# Patient Record
Sex: Female | Born: 1965 | Hispanic: No | Marital: Married | Smoking: Never smoker
Health system: Southern US, Community
[De-identification: ages and names within clinical notes are randomized; demographics above are authoritative.]

## PROBLEM LIST (undated history)

## (undated) DIAGNOSIS — T7840XA Allergy, unspecified, initial encounter: Secondary | ICD-10-CM

## (undated) DIAGNOSIS — E785 Hyperlipidemia, unspecified: Secondary | ICD-10-CM

## (undated) DIAGNOSIS — D219 Benign neoplasm of connective and other soft tissue, unspecified: Secondary | ICD-10-CM

## (undated) DIAGNOSIS — K219 Gastro-esophageal reflux disease without esophagitis: Secondary | ICD-10-CM

## (undated) DIAGNOSIS — N938 Other specified abnormal uterine and vaginal bleeding: Secondary | ICD-10-CM

## (undated) DIAGNOSIS — D649 Anemia, unspecified: Secondary | ICD-10-CM

## (undated) DIAGNOSIS — I10 Essential (primary) hypertension: Secondary | ICD-10-CM

## (undated) HISTORY — DX: Other specified abnormal uterine and vaginal bleeding: N93.8

## (undated) HISTORY — DX: Gastro-esophageal reflux disease without esophagitis: K21.9

## (undated) HISTORY — DX: Essential (primary) hypertension: I10

## (undated) HISTORY — DX: Allergy, unspecified, initial encounter: T78.40XA

## (undated) HISTORY — DX: Benign neoplasm of connective and other soft tissue, unspecified: D21.9

## (undated) HISTORY — DX: Anemia, unspecified: D64.9

## (undated) HISTORY — DX: Hyperlipidemia, unspecified: E78.5

---

## 2003-01-07 ENCOUNTER — Encounter: Admission: RE | Admit: 2003-01-07 | Discharge: 2003-01-07 | Payer: Self-pay | Admitting: *Deleted

## 2003-01-15 ENCOUNTER — Encounter: Admission: RE | Admit: 2003-01-15 | Discharge: 2003-01-15 | Payer: Self-pay | Admitting: *Deleted

## 2003-01-21 ENCOUNTER — Encounter: Payer: Self-pay | Admitting: Obstetrics and Gynecology

## 2003-01-21 ENCOUNTER — Inpatient Hospital Stay (HOSPITAL_COMMUNITY): Admission: AD | Admit: 2003-01-21 | Discharge: 2003-01-22 | Payer: Self-pay | Admitting: Obstetrics and Gynecology

## 2003-01-21 ENCOUNTER — Encounter: Admission: RE | Admit: 2003-01-21 | Discharge: 2003-01-21 | Payer: Self-pay | Admitting: *Deleted

## 2003-01-29 ENCOUNTER — Encounter: Admission: RE | Admit: 2003-01-29 | Discharge: 2003-01-29 | Payer: Self-pay | Admitting: Family Medicine

## 2003-02-05 ENCOUNTER — Encounter: Admission: RE | Admit: 2003-02-05 | Discharge: 2003-02-05 | Payer: Self-pay | Admitting: Family Medicine

## 2003-02-09 ENCOUNTER — Encounter: Admission: RE | Admit: 2003-02-09 | Discharge: 2003-02-09 | Payer: Self-pay | Admitting: *Deleted

## 2003-02-12 ENCOUNTER — Encounter: Admission: RE | Admit: 2003-02-12 | Discharge: 2003-02-12 | Payer: Self-pay | Admitting: Family Medicine

## 2003-02-16 ENCOUNTER — Encounter: Admission: RE | Admit: 2003-02-16 | Discharge: 2003-02-16 | Payer: Self-pay | Admitting: *Deleted

## 2003-02-19 ENCOUNTER — Encounter: Admission: RE | Admit: 2003-02-19 | Discharge: 2003-02-19 | Payer: Self-pay | Admitting: Family Medicine

## 2003-02-22 ENCOUNTER — Inpatient Hospital Stay (HOSPITAL_COMMUNITY): Admission: RE | Admit: 2003-02-22 | Discharge: 2003-03-11 | Payer: Self-pay | Admitting: Family Medicine

## 2003-02-24 ENCOUNTER — Encounter: Payer: Self-pay | Admitting: *Deleted

## 2003-02-28 ENCOUNTER — Encounter: Payer: Self-pay | Admitting: *Deleted

## 2003-03-06 ENCOUNTER — Encounter: Payer: Self-pay | Admitting: *Deleted

## 2003-05-08 ENCOUNTER — Encounter: Admission: RE | Admit: 2003-05-08 | Discharge: 2003-05-08 | Payer: Self-pay | Admitting: *Deleted

## 2010-02-11 ENCOUNTER — Ambulatory Visit: Payer: Self-pay | Admitting: Diagnostic Radiology

## 2010-02-11 ENCOUNTER — Ambulatory Visit (HOSPITAL_BASED_OUTPATIENT_CLINIC_OR_DEPARTMENT_OTHER): Admission: RE | Admit: 2010-02-11 | Discharge: 2010-02-11 | Payer: Self-pay | Admitting: Unknown Physician Specialty

## 2010-12-09 NOTE — Discharge Summary (Signed)
Renee Pope, Renee Pope                            ACCOUNT NO.:  [id number]   MEDICAL RECORD NO.:  [id number]                   PATIENT TYPE:  INP   LOCATION:  9109                                 [facility]ILITY:  [facility]   PHYSICIAN:  Conni Elliot, M.D.             DATE OF BIRTH:  [birth year]   DATE OF ADMISSION:  02/22/2003  DATE OF DISCHARGE:  03/11/2003                                 DISCHARGE SUMMARY   PRIMARY CARE PHYSICIAN:  [facility].   REFERRING PHYSICIAN:  None.   CONSULTING PHYSICIAN:  None.   FINAL DIAGNOSES:  93. A 45 year old G5, now P5-0-0-5 status post normal spontaneous vaginal     delivery.  2. Insulin-dependent gestational diabetes.   PRINCIPAL PROCEDURES:  None.   ADMISSION HISTORY AND PHYSICAL:  Please see chart.   LABORATORY DATA:  Hematocrit and hemoglobin at time of discharge were 27.9  and  9.6 with fasting blood sugars in the 80s-100s.   HOSPITAL COURSE:  Renee Pope was a 45 year old G5, P4-0-0-4 who presented at  57 and [redacted] weeks gestational age by a 28-week ultrasound.  She was admitted  for induction of labor secondary to polyhydramnios and very poorly  controlled gestational diabetes mellitus.  The first attempt of induction of  labor was unsuccessful with the patient developing pretty severe  hyperstimulation of the cervix and uterus.  The induction was stopped.  Several days later another attempt at induction was performed and this, too,  resulted in hyperstimulation and again the induction was stopped.  At that  point, her information was further reviewed and it was felt best to wait  given the poor dating and the large size of the baby.  The patient was in-  house for approximately another week and a half while we attempted to gain  better control of her gestational diabetes and monitor the child.  The baby  did well throughout this time and the mother, however, did continue to have  elevated blood pressures.  However, PIH panel and  urinalysis were always  within normal limits.  She was on insulin throughout her stay of 12 units of  NPH in the morning and 10 units in the evening.  On the 15th of August  another induction of labor was begun.  At this point her cervix was open at  2, very thick, and posterior.  Attempt at placing a Foley bulb for cervical  dilation was attempted, but was unsuccessful.  The patient was started on  low dose Pitocin and she progressed over the next 24 hours to having a  normal spontaneous vaginal delivery around 7:30 p.m. on the 16th of August.  At delivery she did have a small second degree laceration which was repaired  with 3-0 Vicryl with a three cord placenta that delivered spontaneously  approximately 10 minutes after delivery.  There was no postpartum hemorrhage  and the patient  did well postpartum with no complications.  Circumcision was  performed on the baby and the baby was able to go home with mother.  The  patient desires to breast-feed the infant and desires Depo-Provera injection  for contraception.   DISCHARGE INSTRUCTIONS:  The family was instructed to follow up with [facility] in six weeks.  She was instructed to return to a normal diet and to  follow up with [facility] for her diabetes status.   DISCHARGE MEDICATIONS:  1. Prenatal vitamins one tablet p.o. daily while breast-feeding.  2. Ibuprofen 600 mg one tablet p.o. q.6h.   _________  None.   DNR status full code.     Penni Bombard, MD                          Conni Elliot, M.D.    SJ/MEDQ  D:  03/11/2003  T:  03/11/2003  Job:  [id number]   cc:   [facility]

## 2010-12-09 NOTE — Group Therapy Note (Signed)
   NAME:  Renee Pope, Renee Pope                            ACCOUNT NO.:  n/a   MEDICAL RECORD NO.:  [id number]                   PATIENT TYPE:   LOCATION:  [facility]                           FACILITY:   PHYSICIAN:  Tinnie Gens, MD                     DATE OF BIRTH:   DATE OF SERVICE:  05/08/2003                                    CLINIC NOTE   CHIEF COMPLAINT:  Postpartum check.   HISTORY OF PRESENT ILLNESS:  The patient is a 45 year old gravida 5 para 5-0-  0-5 who is followed in the high risk clinic for gestational diabetes A2-B,  insulin requiring during her last pregnancy.  She did finally give birth to  a female infant without difficulty.  She is nursing.  She has received Depo-  Provera for contraception.  She denies any problems with depression or  sadness.  She is not sleeping well but that is secondary to having an infant  at home.   PHYSICAL EXAMINATION:  VITAL SIGNS:  Her pulse is 68, blood pressure is  115/69, weight is 160.3.  GENERAL:  She is a well-developed, well-nourished [place] female in no acute  distress.  ABDOMEN:  Soft and nontender.  GENITOURINARY:  She has normal external female genitalia.  The cervix is  visualized without lesion.  The vagina is rugated and clean.  The uterus was  small and anteverted.  The cervix was not open.  Adnexa are benign.   IMPRESSION:  1. Postpartum check.  2. History of gestational diabetes which is today at 41.   PLAN:  The patient needs a Pap smear in May 2005 and have advised her to  return to the [facility] Department for continuation of her Depo-  Provera by June 01, 2003.                                                Tinnie Gens, MD    TP/MEDQ  D:  05/08/2003  T:  05/08/2003  Job:  [id number]

## 2010-12-09 NOTE — Discharge Summary (Signed)
Renee Pope, Renee Pope                            ACCOUNT NO.:  [id number]   MEDICAL RECORD NO.:  [id number]                   PATIENT TYPE:  INP   LOCATION:  9156                                 [facility]   PHYSICIAN:  Nilda Simmer, M.D.                  DATE OF BIRTH:  1966/03/15   DATE OF ADMISSION:  01/21/2003  DATE OF DISCHARGE:  01/22/2003                                 DISCHARGE SUMMARY   OBSTETRICAL CLINIC:  [facility].   PROCEDURES ON ADMISSION:  1. OB ultrasound for size and growth.  2. Fetal monitoring.   DISCHARGE DIAGNOSES:  1. Gestational diabetes, insulin-requiring.  2. Intrauterine pregnancy at 34-3/[redacted] weeks gestation.  3. Language barrier.   DISCHARGE MEDICATIONS:  1. NPH insulin 10 units subcu q.a.m., 10 units subcu q.h.s.  2. Prenatal vitamins.   FOLLOWUP APPOINTMENT:  The patient has a follow up appointment at the [facility] on Thursday, July 8th at 10:30 a.m.  She will have antenatal  testing performed at that time to include an NST and BPP.   HOSPITAL COURSE:  Ms. Thurow is a 45 year old G 5, P 4-0-0-4 presenting at 66-  2/[redacted] weeks gestation to initiate insulin regime for gestational diabetes.  The patient was initiated on NPH insulin 10 units in the morning and 10  units in the evening and sugars were monitored.  Sugars ranged between 96-  136 during hospitalization.  Fasting CBG was 96 and postprandial of 136.  The patient also received nutritional education during hospitalization.  The  patient recently received diabetic education by Lenor Coffin as an outpatient.  OB ultrasound was also performed.  On hospital day #2 the patient was eager  to be discharged home secondary to transportation issues and desires for a  discharge.  Due to the patient's insistence on being discharged on hospital  day #2, BPP was scheduled and NST was scheduled as an outpatient.   DISCHARGE LABORATORY DATA:  OB ultrasound revealed a single  fetus with a  heart rate of 131 with fetal movement; however, no fetal breathing.  A  cephalic presentation, placenta location was lateral without evidence of a  previa and it was grade I, amniotic fluid was normal with an AFI of 14.3.  Size of the infant was estimated at 35-0/7 weeks in comparison to the  patient's gestation age of 34-2/[redacted] weeks gestation.  Head circumference  consistent with 36-2/[redacted] weeks gestation, abdominal circumference consistent  with 34-6/[redacted] weeks gestation, femur length of 32-6/[redacted] weeks gestation.  Sex is  female and cervix was 5.2 cm transabdominally.    DISCHARGE INSTRUCTIONS:  1. Diet at home:  Diabetic diet.  2. Activities:  No restrictions.  3. Sexual activity:  No restrictions.  Nilda Simmer, M.D.    KS/MEDQ  D:  01/22/2003  T:  01/22/2003  Job:  [id number]   cc:   [facility]

## 2011-04-20 ENCOUNTER — Emergency Department (HOSPITAL_COMMUNITY)
Admission: EM | Admit: 2011-04-20 | Discharge: 2011-04-20 | Disposition: A | Discharge status: Home / Self Care | Payer: Medicaid Other | Attending: Emergency Medicine | Admitting: Emergency Medicine

## 2011-04-20 DIAGNOSIS — R109 Unspecified abdominal pain: Secondary | ICD-10-CM | POA: Insufficient documentation

## 2011-04-20 DIAGNOSIS — R630 Anorexia: Secondary | ICD-10-CM | POA: Insufficient documentation

## 2011-04-20 DIAGNOSIS — E876 Hypokalemia: Secondary | ICD-10-CM | POA: Insufficient documentation

## 2011-04-20 DIAGNOSIS — E871 Hypo-osmolality and hyponatremia: Secondary | ICD-10-CM | POA: Insufficient documentation

## 2011-04-20 DIAGNOSIS — Z79899 Other long term (current) drug therapy: Secondary | ICD-10-CM | POA: Insufficient documentation

## 2011-04-20 DIAGNOSIS — R51 Headache: Secondary | ICD-10-CM | POA: Insufficient documentation

## 2011-04-20 DIAGNOSIS — E119 Type 2 diabetes mellitus without complications: Secondary | ICD-10-CM | POA: Insufficient documentation

## 2011-04-20 LAB — URINALYSIS, ROUTINE W REFLEX MICROSCOPIC
Glucose, UA: NEGATIVE mg/dL
Leukocytes, UA: NEGATIVE
Specific Gravity, Urine: 1.006 (ref 1.005–1.030)
pH: 6.5 (ref 5.0–8.0)

## 2011-04-20 LAB — COMPREHENSIVE METABOLIC PANEL
ALT: 24 U/L (ref 0–35)
AST: 21 U/L (ref 0–37)
Albumin: 3.4 g/dL — ABNORMAL LOW (ref 3.5–5.2)
Calcium: 9 mg/dL (ref 8.4–10.5)
Creatinine, Ser: 0.58 mg/dL (ref 0.50–1.10)
Sodium: 128 mEq/L — ABNORMAL LOW (ref 135–145)

## 2011-04-20 LAB — CBC
HCT: 31.5 % — ABNORMAL LOW (ref 36.0–46.0)
Hemoglobin: 10.6 g/dL — ABNORMAL LOW (ref 12.0–15.0)
Platelets: 266 10*3/uL (ref 150–400)
RBC: 4.47 MIL/uL (ref 3.87–5.11)

## 2011-04-20 LAB — DIFFERENTIAL
Basophils Absolute: 0 10*3/uL (ref 0.0–0.1)
Basophils Relative: 0 % (ref 0–1)
Eosinophils Absolute: 0.2 10*3/uL (ref 0.0–0.7)

## 2011-04-21 LAB — URINE CULTURE
Colony Count: 5000
Culture  Setup Time: 201209272248

## 2011-06-14 ENCOUNTER — Encounter: Payer: Self-pay | Admitting: Family Medicine

## 2011-06-14 ENCOUNTER — Ambulatory Visit (INDEPENDENT_AMBULATORY_CARE_PROVIDER_SITE_OTHER): Payer: Self-pay | Admitting: Family Medicine

## 2011-06-14 DIAGNOSIS — E119 Type 2 diabetes mellitus without complications: Secondary | ICD-10-CM

## 2011-06-14 DIAGNOSIS — N938 Other specified abnormal uterine and vaginal bleeding: Secondary | ICD-10-CM

## 2011-06-14 DIAGNOSIS — Z01419 Encounter for gynecological examination (general) (routine) without abnormal findings: Secondary | ICD-10-CM

## 2011-06-14 DIAGNOSIS — E785 Hyperlipidemia, unspecified: Secondary | ICD-10-CM | POA: Insufficient documentation

## 2011-06-14 DIAGNOSIS — Z01812 Encounter for preprocedural laboratory examination: Secondary | ICD-10-CM

## 2011-06-14 DIAGNOSIS — I10 Essential (primary) hypertension: Secondary | ICD-10-CM | POA: Insufficient documentation

## 2011-06-14 DIAGNOSIS — D259 Leiomyoma of uterus, unspecified: Secondary | ICD-10-CM

## 2011-06-14 DIAGNOSIS — N949 Unspecified condition associated with female genital organs and menstrual cycle: Secondary | ICD-10-CM

## 2011-06-14 NOTE — Progress Notes (Signed)
  Subjective:     Renee Pope is an 45 y.o. woman who presents for irregular menses. She had been bleeding regularly. She is now bleeding every 26 days and menses are lasting 9-14 days. She changes her pad or tampon every several hours.  Dysmenorrhea:moderate, occurring throughout menses. Cyclic symptoms include: none. Current contraception: OCP (estrogen/progesterone). History of infertility: no. History of abnormal Pap smear: no.  Menstrual History: OB History    Grav Para Term Preterm Abortions TAB SAB Ect Mult Living   5 5 5  0 0 0 0 0 0 5      Patient's last menstrual period was 06/13/2011.    The following portions of the patient's history were reviewed and updated as appropriate: allergies, current medications, past family history, past medical history, past social history, past surgical history and problem list.  Review of Systems Pertinent items are noted in HPI.    Objective:    BP 169/100  Pulse 87  Temp(Src) 97.2 F (36.2 C) (Oral)  Ht 5\' 2"  (1.575 m)  Wt 170 lb 1.6 oz (77.157 kg)  BMI 31.11 kg/m2  LMP 06/13/2011  General:   alert, cooperative and no distress  Skin:    normal  Neck:  no adenopathy, no carotid bruit, no JVD, supple, symmetrical, trachea midline and thyroid not enlarged, symmetric, no tenderness/mass/nodules  Abdomen:  soft, non-tender; bowel sounds normal; no masses,  no organomegaly  Pelvic:   cervix normal in appearance, external genitalia normal, no adnexal masses or tenderness, no cervical motion tenderness, rectovaginal septum normal, uterus normal size, shape, and consistency and vagina normal without discharge     Assessment:    dysfunctional uterine bleeding    Plan:    Abdominal ultrasound. All questions answered. Blood tests: CBC with diff and TSH. Chlamydia specimen. Follow up in 4 weeks. Pap smear.   Will do endometrial biopsy when have results of ultrasound.

## 2011-06-14 NOTE — Progress Notes (Deleted)
  Subjective:    Patient ID: Renee Pope, female    DOB: 12/28/1965, 45 y.o.   MRN: [id number]  HPI    Review of Systems     Objective:   Physical Exam        Assessment & Plan:

## 2011-06-14 NOTE — Patient Instructions (Signed)
Place abnormal uterine bleeding patient instructions here.

## 2011-06-15 LAB — CBC
Hemoglobin: 10.4 g/dL — ABNORMAL LOW (ref 12.0–15.0)
MCHC: 31 g/dL (ref 30.0–36.0)
Platelets: 373 10*3/uL (ref 150–400)
RBC: 4.67 MIL/uL (ref 3.87–5.11)

## 2011-06-15 LAB — TSH: TSH: 0.607 u[IU]/mL (ref 0.350–4.500)

## 2011-06-20 ENCOUNTER — Ambulatory Visit (HOSPITAL_COMMUNITY)
Admission: RE | Admit: 2011-06-20 | Discharge: 2011-06-20 | Disposition: A | Discharge status: Home / Self Care | Payer: Medicaid Other | Source: Ambulatory Visit | Attending: Family Medicine | Admitting: Family Medicine

## 2011-06-20 DIAGNOSIS — N938 Other specified abnormal uterine and vaginal bleeding: Secondary | ICD-10-CM | POA: Insufficient documentation

## 2011-06-20 DIAGNOSIS — D259 Leiomyoma of uterus, unspecified: Secondary | ICD-10-CM | POA: Insufficient documentation

## 2011-06-20 DIAGNOSIS — N949 Unspecified condition associated with female genital organs and menstrual cycle: Secondary | ICD-10-CM | POA: Insufficient documentation

## 2011-06-20 DIAGNOSIS — N852 Hypertrophy of uterus: Secondary | ICD-10-CM | POA: Insufficient documentation

## 2011-07-19 ENCOUNTER — Other Ambulatory Visit (HOSPITAL_COMMUNITY)
Admission: RE | Admit: 2011-07-19 | Discharge: 2011-07-19 | Disposition: A | Discharge status: Home / Self Care | Payer: Self-pay | Source: Ambulatory Visit | Attending: Family | Admitting: Family

## 2011-07-19 ENCOUNTER — Encounter: Payer: Self-pay | Admitting: Family

## 2011-07-19 ENCOUNTER — Ambulatory Visit (INDEPENDENT_AMBULATORY_CARE_PROVIDER_SITE_OTHER): Payer: Self-pay | Admitting: Family

## 2011-07-19 VITALS — BP 134/88 | HR 89 | Temp 97.3°F | Ht 60.5 in | Wt 171.1 lb

## 2011-07-19 DIAGNOSIS — N938 Other specified abnormal uterine and vaginal bleeding: Secondary | ICD-10-CM

## 2011-07-19 DIAGNOSIS — N949 Unspecified condition associated with female genital organs and menstrual cycle: Secondary | ICD-10-CM | POA: Insufficient documentation

## 2011-07-19 LAB — POCT PREGNANCY, URINE: Preg Test, Ur: NEGATIVE

## 2011-07-19 LAB — T3, FREE: T3, Free: 3.3 pg/mL (ref 2.3–4.2)

## 2011-07-19 NOTE — Progress Notes (Signed)
Addended by: Faythe Casa on: 07/19/2011 03:58 PM   Modules accepted: Orders

## 2011-07-19 NOTE — Patient Instructions (Addendum)
Place abnormal uterine bleeding patient instructions here. Dysfunctional Uterine Bleeding Normally, menstrual periods begin between ages 46 to 25 in young women. A normal menstrual cycle/period may begin every 23 days up to 35 days and lasts from 1 to 7 days. Around 12 to 14 days before your menstrual period starts, ovulation (ovary produces an egg) occurs. When counting the time between menstrual periods, count from the first day of bleeding of the previous period to the first day of bleeding of the next period. Dysfunctional (abnormal) uterine bleeding is bleeding that is different from a normal menstrual period. Your periods may come earlier or later than usual. They may be lighter, have blood clots or be heavier. You may have bleeding between periods, or you may skip one period or more. You may have bleeding after sexual intercourse, bleeding after menopause, or no menstrual period. CAUSES    Pregnancy (normal, miscarriage, tubal).     IUDs (intrauterine device, birth control).     Birth control pills.     Hormone treatment.     Menopause.    Infection of the cervix.     Blood clotting problems.     Infection of the inside lining of the uterus.     Endometriosis, inside lining of the uterus growing in the pelvis and other female organs.     Adhesions (scar tissue) inside the uterus.     Obesity or severe weight loss.     Uterine polyps inside the uterus.     Cancer of the vagina, cervix, or uterus.     Ovarian cysts or polycystic ovary syndrome.     Medical problems (diabetes, thyroid disease).     Uterine fibroids (noncancerous tumor).     Problems with your female hormones.     Endometrial hyperplasia, very thick lining and enlarged cells inside of the uterus.     Medicines that interfere with ovulation.     Radiation to the pelvis or abdomen.     Chemotherapy.  DIAGNOSIS   Your doctor will discuss the history of your menstrual periods, medicines you are  taking, changes in your weight, stress in your life, and any medical problems you may have.     Your doctor will do a physical and pelvic examination.     Your doctor may want to perform certain tests to make a diagnosis, such as:     Pap test.     Blood tests.     Cultures for infection.     CT scan.     Ultrasound.    Hysteroscopy.    Laparoscopy.    MRI.    Hysterosalpingography.    D and C.     Endometrial biopsy.  TREATMENT   Treatment will depend on the cause of the dysfunctional uterine bleeding (DUB). Treatment may include:  Observing your menstrual periods for a couple of months.     Prescribing medicines for medical problems, including:     Antibiotics.    Hormones.    Birth control pills.     Removing an IUD (intrauterine device, birth control).     Surgery:    D and C (scrape and remove tissue from inside the uterus).     Laparoscopy (examine inside the abdomen with a lighted tube).     Uterine ablation (destroy lining of the uterus with electrical current, laser, heat, or freezing).     Hysteroscopy (examine cervix and uterus with a lighted tube).     Hysterectomy (remove the uterus).  HOME CARE INSTRUCTIONS    If medicines were prescribed, take exactly as directed. Do not change or switch medicines without consulting your caregiver.     Long term heavy bleeding may result in iron deficiency. Your caregiver may have prescribed iron pills. They help replace the iron that your body lost from heavy bleeding. Take exactly as directed.     Do not take aspirin or medicines that contain aspirin one week before or during your menstrual period. Aspirin may make the bleeding worse.     If you need to change your sanitary pad or tampon more than once every 2 hours, stay in bed with your feet elevated and a cold pack on your lower abdomen. Rest as much as possible, until the bleeding stops or slows down.     Eat well-balanced meals. Eat foods high in  iron. Examples are:     Leafy green vegetables.     Whole-grain breads and cereals.     Eggs.    Meat.    Liver.    Do not try to lose weight until the abnormal bleeding has stopped and your blood iron level is back to normal. Do not lift more than ten pounds or do strenuous activities when you are bleeding.     For a couple of months, make note on your calendar, marking the start and ending of your period, and the type of bleeding (light, medium, heavy, spotting, clots or missed periods). This is for your caregiver to better evaluate your problem.  SEEK MEDICAL CARE IF:    You develop nausea (feeling sick to your stomach) and vomiting, dizziness, or diarrhea while you are taking your medicine.     You are getting lightheaded or weak.     You have any problems that may be related to the medicine you are taking.     You develop pain with your DUB.     You want to remove your IUD.     You want to stop or change your birth control pills or hormones.     You have any type of abnormal bleeding mentioned above.     You are over 65 years old and have not had a menstrual period yet.     You are 45 years old and you are still having menstrual periods.     You have any of the symptoms mentioned above.     You develop a rash.  SEEK IMMEDIATE MEDICAL CARE IF:    An oral temperature above 102 F (38.9 C) develops.     You develop chills.     You are changing your sanitary pad or tampon more than once an hour.     You develop abdominal pain.     You pass out or faint.  Document Released: 07/07/2000 Document Revised: 03/22/2011 Document Reviewed: 06/08/2009 [facility] Patient Information 2012 [place], [place].

## 2011-07-19 NOTE — Progress Notes (Signed)
  Subjective:     Renee Pope is an 45 y.o. woman who presents for irregular menses. Previously seen in clinic on 06/14/11 at that visit labs were completed and ultrasound schedule.  Hgb was stable at 10.  Ultrasound showed multiple fibroids (see report) and a polyp.  Pt reports bleeding has improved with LMP on 07/12/11.  Current contraception: OCP (estrogen/progesterone). History of infertility: no. History of abnormal Pap smear: no.  Menstrual History: OB History    Grav Para Term Preterm Abortions TAB SAB Ect Mult Living   5 5 5  0 0 0 0 0 0 5       Patient's last menstrual period was 07/12/2011.    The following portions of the patient's history were reviewed and updated as appropriate: allergies, current medications, past family history, past medical history, past social history, past surgical history and problem list.  Review of Systems Pertinent items are noted in HPI.    Objective:    BP 134/88  Pulse 89  Temp(Src) 97.3 F (36.3 C) (Oral)  Ht 5' 0.5" (1.537 m)  Wt 171 lb 1.6 oz (77.61 kg)  BMI 32.87 kg/m2  LMP 07/12/2011  General:   alert, cooperative and appears stated age  Skin:    normal  Neck:  supple, symmetrical, trachea midline and thyroid not enlarged, symmetric, no tenderness/mass/nodules  Abdomen:  soft, non-tender; bowel sounds normal; no masses,  no organomegaly  Pelvic:   cervix normal in appearance, external genitalia normal, no adnexal masses or tenderness, no cervical motion tenderness, rectovaginal septum normal, vagina normal without discharge and uterus enlarged and tender with palpation.    Endometrial Biopsy Procedure Note  Pre-operative Diagnosis: Dysfunctional Uterine Bleeding; Fibroids  Post-operative Diagnosis: same  Indications: abnormal uterine bleeding, enlarged uterus  Procedure Details   Urine pregnancy test was done and result was negative.  The risks (including infection, bleeding, pain, and uterine perforation) and benefits of the  procedure were explained to the patient and Verbal informed consent was obtained.  Antibiotic prophylaxis against endocarditis was not indicated.   The patient was placed in the dorsal lithotomy position.  Bimanual exam showed the uterus to be in the neutral position.  A Graves' speculum inserted in the vagina, and the cervix prepped with povidone iodine.  Endocervical curettage with a Kevorkian curette was performed.  Uterus sounded to 8 cm;  Pipelle endometrial aspirator was used to sample the endometrium.  Sample was sent for pathologic examination.  Condition: Stable  Complications: None  Plan:  The patient was advised to call for any fever or for prolonged or severe pain or bleeding. She was advised to use NSAID as needed for mild to moderate pain. She was advised to avoid vaginal intercourse for 48 hours or until the bleeding has completely stopped.   Assessment:    dysfunctional uterine bleeding and uterine fibroids    Plan:  Consulted with Dr. Jolayne Panther > agrees with plan  Blood tests: Free T4 level and Free T4. Endometrial biopsy - see separate procedure note. Follow up in 2 weeks.

## 2011-08-03 ENCOUNTER — Ambulatory Visit (INDEPENDENT_AMBULATORY_CARE_PROVIDER_SITE_OTHER): Payer: Medicaid Other | Admitting: Physician Assistant

## 2011-08-03 VITALS — BP 122/73 | HR 85 | Temp 98.9°F | Ht 60.5 in | Wt 171.9 lb

## 2011-08-03 DIAGNOSIS — N949 Unspecified condition associated with female genital organs and menstrual cycle: Secondary | ICD-10-CM

## 2011-08-03 DIAGNOSIS — N938 Other specified abnormal uterine and vaginal bleeding: Secondary | ICD-10-CM

## 2011-08-03 MED ORDER — NORETHINDRONE ACET-ETHINYL EST 1-20 MG-MCG PO TABS: 1.0000 | ORAL_TABLET | Freq: Every day | ORAL | Status: DC

## 2011-08-03 NOTE — Patient Instructions (Signed)

## 2011-08-03 NOTE — Progress Notes (Signed)
Chief Complaint:  Results   Renee Pope is  46 y.o. [id number].  Patient's last menstrual period was 07/12/2011..  She presents complaining of Results  States light period on OCPs lasting 3-4 days. Denies pain or abd cramping.  Obstetrical/Gynecological History: OB History    Grav Para Term Preterm Abortions TAB SAB Ect Mult Living   5 5 5  0 0 0 0 0 0 5      Past Medical History: Past Medical History  Diagnosis Date  . Diabetes mellitus   . Hypertension   . Hyperlipidemia   . Allergy   . Anemia   . GERD (gastroesophageal reflux disease)   . DUB (dysfunctional uterine bleeding)     Past Surgical History: No past surgical history on file.  Family History: Family History  Problem Relation Age of Onset  . Diabetes Father   . Diabetes Sister     Social History: History  Substance Use Topics  . Smoking status: Never Smoker   . Smokeless tobacco: Never Used  . Alcohol Use: No    Allergies: No Known Allergies   (Not in a hospital admission)  Review of Systems - Negative except what has been reviewed in the HPI  Physical Exam   Blood pressure 122/73, pulse 85, temperature 98.9 F (37.2 C), temperature source Oral, height 5' 0.5" (1.537 m), weight 171 lb 14.4 oz (77.973 kg), last menstrual period 07/12/2011.  General: General appearance - alert, well appearing, and in no distress, oriented to person, place, and time and overweight Mental status - alert, oriented to person, place, and time, normal mood, behavior, speech, dress, motor activity, and thought processes, affect appropriate to mood Focused Gynecological Exam: examination not indicated  Labs: REPORT OF SURGICAL PATHOLOGY FINAL DIAGNOSIS Diagnosis Endometrium, biopsy - SCANT LOWER UTERINE SEGMENT TISSUE. - NO ATYPIA OR MALIGNANCY IDENTIFIED. - SEE COMMENT.  Free T3/T4: NL    Assessment: Patient Active Problem List  Diagnoses  . Diabetes mellitus, type 2  . HTN (hypertension)  . Hyperlipidemia    . Fibroid uterus    Plan: Continue OCPs as prescribed for management of DUB Discussed results as above and indication for surgical management to include uncontrolled bleeding and uncontrolled pain caused by fibroids that interferes with ADLs. Medical management working at present and may be continued at this time.  FU prn worsening symptoms.  Renee Pope E. 08/03/2011,4:02 PM

## 2011-09-27 ENCOUNTER — Encounter: Payer: Self-pay | Admitting: Obstetrics & Gynecology

## 2011-09-27 ENCOUNTER — Ambulatory Visit (INDEPENDENT_AMBULATORY_CARE_PROVIDER_SITE_OTHER): Payer: Self-pay | Admitting: Obstetrics & Gynecology

## 2011-09-27 VITALS — BP 118/76 | HR 88 | Temp 97.6°F | Ht 60.5 in | Wt 171.0 lb

## 2011-09-27 DIAGNOSIS — D219 Benign neoplasm of connective and other soft tissue, unspecified: Secondary | ICD-10-CM

## 2011-09-27 DIAGNOSIS — D649 Anemia, unspecified: Secondary | ICD-10-CM

## 2011-09-27 DIAGNOSIS — D259 Leiomyoma of uterus, unspecified: Secondary | ICD-10-CM

## 2011-09-27 NOTE — Progress Notes (Signed)
  Subjective:    Patient ID: Renee Pope, female    DOB: 1965/10/10, 46 y.o.   MRN: [id number]  HPI  Ms. Hhan is an [place]-speaking lady who has known fibroids. She was placed on the OCP to control her bleeding. I was unable to ascertain, even with the language line and her English-speaking daughter, if this has helped. I offered her Mirena, [place], RATH. I gave her written information and expect that with the help of her daughter she can gain a good understanding of these options. I also made it clear that if the OCPs are working, then she can just stay with that treatment  Review of Systems Endometrial biopsy negative    Objective:   Physical Exam  Bimanual exam reveals a 12 week size uterus. My exam was limited by her body habitus and her reluctance to having a pelvic exam.      Assessment & Plan:  Fibroids and anemia- above options discussed. She will come back when she has made a decision.

## 2011-10-24 ENCOUNTER — Ambulatory Visit (INDEPENDENT_AMBULATORY_CARE_PROVIDER_SITE_OTHER): Payer: Self-pay | Admitting: Family Medicine

## 2011-10-24 ENCOUNTER — Encounter: Payer: Self-pay | Admitting: Family Medicine

## 2011-10-24 VITALS — BP 123/79 | HR 90 | Ht 61.5 in | Wt 170.5 lb

## 2011-10-24 DIAGNOSIS — E119 Type 2 diabetes mellitus without complications: Secondary | ICD-10-CM

## 2011-10-24 DIAGNOSIS — E785 Hyperlipidemia, unspecified: Secondary | ICD-10-CM

## 2011-10-24 DIAGNOSIS — D649 Anemia, unspecified: Secondary | ICD-10-CM

## 2011-10-24 DIAGNOSIS — R011 Cardiac murmur, unspecified: Secondary | ICD-10-CM

## 2011-10-24 DIAGNOSIS — K219 Gastro-esophageal reflux disease without esophagitis: Secondary | ICD-10-CM | POA: Insufficient documentation

## 2011-10-24 DIAGNOSIS — D259 Leiomyoma of uterus, unspecified: Secondary | ICD-10-CM

## 2011-10-24 DIAGNOSIS — I10 Essential (primary) hypertension: Secondary | ICD-10-CM

## 2011-10-24 LAB — COMPREHENSIVE METABOLIC PANEL
ALT: 29 U/L (ref 0–35)
BUN: 8 mg/dL (ref 6–23)
CO2: 21 mEq/L (ref 19–32)
Calcium: 9.2 mg/dL (ref 8.4–10.5)
Chloride: 106 mEq/L (ref 96–112)
Creat: 0.78 mg/dL (ref 0.50–1.10)
Total Bilirubin: 0.3 mg/dL (ref 0.3–1.2)

## 2011-10-24 LAB — LDL CHOLESTEROL, DIRECT: Direct LDL: 42 mg/dL

## 2011-10-24 LAB — POCT GLYCOSYLATED HEMOGLOBIN (HGB A1C): Hemoglobin A1C: 6.3

## 2011-10-24 MED ORDER — ROSUVASTATIN CALCIUM 5 MG PO TABS: 5.0000 mg | ORAL_TABLET | Freq: Every day | ORAL | Status: DC

## 2011-10-24 MED ORDER — ESOMEPRAZOLE MAGNESIUM 40 MG PO CPDR: 40.0000 mg | DELAYED_RELEASE_CAPSULE | Freq: Every day | ORAL | Status: DC

## 2011-10-24 NOTE — Patient Instructions (Addendum)
It was great meeting you today!  For the lab work, we checked for the anemia, for the cholesterol and for the kidney function, sodium and potassium. I will be in touch with the results.   For the murmur, next visit, I will probably send a referral for an ultrasound.  At the next visit, we will also check the thyroid hormone for the goiter.   For the diabetes, you can take glipizide one in the morning and one at night. Check your sugar at the beginning. If it is lower than 100 and you are feeling bad with it being low, cut back to one pill a day.   Heart Murmur A heart murmur is an extra sound heard by your caregiver when listening to your heart with a device called a stethoscope. The sound might be a "hum" or "whoosh" sound heard when the heart beats. The sound comes from turbulence when blood flows through the heart. There are two types of heart murmurs:  Innocent (Harmless) murmurs: Most people with this type of heart murmur do not have signs or symptoms of heart problems. Many children have innocent heart murmurs. When an innocent heart murmur is found, there is no need to get tests or do treatment. Also, there is no need to restrict activities or stop playing sports. Innocent heart murmurs may be caused by many things. For example, it might be caused by a tiny hole or defect in the wall of the heart. These defects often close as a child grows. An innocent heart murmur may be heard by an examining clinician throughout your life. If you see a new caregiver, please let him or her know this was found during past exams.   Abnormal murmurs: May have signs and symptoms of heart problems. These types of murmurs can occur in children and adults. In children, abnormal heart murmurs are typically caused from heart defects that are present at birth. In adults, abnormal murmurs are usually from heart valve problems caused by disease, infection, or aging.  SYMPTOMS   Innocent (Harmless) murmurs do not cause  symptoms or require you to limit physical activity.   Many people with abnormal murmurs may or may not have symptoms. If symptoms do develop, they might include:   Shortness of breath.   Blue coloring of the skin, especially on the fingertips.   Chest pain.   Palpitations or feeling a "fluttering" or a "skipped" heart beat.   Fainting.   Persistent cough.   Getting tired much faster than expected.  DIAGNOSIS  A heart murmur might be heard during a pre-sports physical or during any type of examination. When a murmur is heard, it may suggest a possible problem. When this happens, your caregiver may ask you to see a heart specialist (cardiologist). You may also be asked to undergo one or more heart tests. In these cases, testing may vary depending upon what your caregiver heard. Tests for a heart murmur might include one or more of the following:  EKG (electrocardiogram).   Echocardiogram.   Cardiac MRI.  For children and adults who have an abnormal heart murmur and want to play sports, it is important to complete testing, review test results, and receive recommendations from your caregiver. If heart disease is present, it may be risky to play. Finding out the results of your test Not all test results are available during your visit. If your test results are not back during the visit, make an appointment with your caregiver to find out  the results. Do not assume everything is normal if you have not heard from your caregiver or the medical facility. It is important for you to follow up on all of your test results.  TREATMENT  As noted above, innocent (harmless) murmurs require no treatment or activity restriction. If the murmur represents a problem with the heart, treatment will depend upon the exact nature of the problem. In these cases, medicine or surgery may be needed to treat the problem. HOME CARE INSTRUCTIONS If you want to participate in sports or other types of strenuous physical  activity, it is important to discuss this first with your caregiver. If the murmur represents a problem with the heart and you choose to participate in sports, there is a small chance that a serious problem (including sudden death) could result.  SEEK MEDICAL CARE IF:   You feel that your symptoms are slowly worsening.   You develop any new symptoms that cause concern.   You feel that you are having side effects from any medicines prescribed.  SEEK IMMEDIATE MEDICAL CARE IF:   Chest pain develops.   You are short of breath.   You notice that your heart beats irregularly often enough to cause you to worry.   You have fainting spells.   There is a worsening of any problems that brought you or your child in for medical care.  Document Released: 08/17/2004 Document Revised: 06/29/2011 Document Reviewed: 09/17/2007 [facility] Patient Information 2012 [place], [place].

## 2011-10-25 ENCOUNTER — Encounter: Payer: Self-pay | Admitting: Family Medicine

## 2011-10-25 DIAGNOSIS — D649 Anemia, unspecified: Secondary | ICD-10-CM | POA: Insufficient documentation

## 2011-10-25 DIAGNOSIS — R011 Cardiac murmur, unspecified: Secondary | ICD-10-CM | POA: Insufficient documentation

## 2011-10-25 LAB — CBC WITH DIFFERENTIAL/PLATELET
Eosinophils Relative: 1 % (ref 0–5)
HCT: 28.1 % — ABNORMAL LOW (ref 36.0–46.0)
Hemoglobin: 8.3 g/dL — ABNORMAL LOW (ref 12.0–15.0)
Lymphocytes Relative: 32 % (ref 12–46)
Lymphs Abs: 2.5 10*3/uL (ref 0.7–4.0)
MCV: 67.5 fL — ABNORMAL LOW (ref 78.0–100.0)
Monocytes Absolute: 0.2 10*3/uL (ref 0.1–1.0)
Monocytes Relative: 2 % — ABNORMAL LOW (ref 3–12)
Neutro Abs: 5 10*3/uL (ref 1.7–7.7)
RDW: 17.5 % — ABNORMAL HIGH (ref 11.5–15.5)
WBC: 7.8 10*3/uL (ref 4.0–10.5)

## 2011-10-25 NOTE — Assessment & Plan Note (Signed)
Will check direct LDL as patient is unable to make it to the clinic in the morning. Would consider have her fast for 8 hours before a lab appointment in the afternoon, but this would not be safe with her being a diabetic.  Currently on crestor 5mg  daily.

## 2011-10-25 NOTE — Assessment & Plan Note (Addendum)
A1C: 6.3 on metformin 1000mg  bid and glipizide 5mg  daily. Talked about increasing to glipizide 5mg  bid if she can tolerate it. Recommended that patient check her glucose and that if it is lower than 100 and she is not feeling well, for her to decrease to glipizide once daily.  Also, currently on ARB for blood pressure. Will check BMP for renal function.  She will also need referral for diabetic eye exam although this may be difficult in the context of no insurance.

## 2011-10-25 NOTE — Assessment & Plan Note (Signed)
Currently on losartan 50mg  daily. At goal BP for diabetes. 123/79. Continue current therapy.

## 2011-10-25 NOTE — Assessment & Plan Note (Signed)
Follow up with Dr. Marice Potter

## 2011-10-25 NOTE — Assessment & Plan Note (Addendum)
Secondary to uterine bleeding from fibroids. Will check CBC. Patient is symptomatic as she complains of dizziness, fatigue and light headedness. Currently on niferex 150mg  bid which is already at maximum dose.

## 2011-10-25 NOTE — Assessment & Plan Note (Signed)
This could be secondary to anemia, but since she has never been told about this murmur, I will order echo at next visit. Went over heart murmurs with patient and her daughter.

## 2011-10-25 NOTE — Progress Notes (Signed)
Patient ID: Renee Pope, female   DOB: 06-Apr-1966, 46 y.o.   MRN: [id number] Patient ID: Renee Pope    DOB: 12/18/1965, 46 y.o.   MRN: [id number] --- Subjective:  Renee Pope is a 46 y.o.female with h/o fibroid, anemia, DM and GERD who presents to establish care. She is from [place] and speaks Urdu. She is accompanied by her daughter who helped translate. Concerns include: - anemia: has had a history of uterine fibroids for which she takes microgestin which has helped some, but she continues having bleeding and feels dizzy and lightheaded when she stands up. She has been taking Niferex 150mg  bid for 4-5 years. She denies any shortness of breath, any chest pain. - uterine fibroids: met with Dr. Marice Potter at [facility] where she went over her options which included: fibroid removal with hysterectomy, uterine artery embolization or mirena. They have opted for mirena as they see the risks to be much lower. She continues having bleeding. When her bleeding is controled, she uses 2-3 pads per day. When it is not well controled, she bleeds heavily for the first 3 days and then continues having lighter bleeding for a few more days.  - Diabetes: takes metformin 1000mg  bid and glipizide 5 qd. She sometimes has low blood sugar around 60-70. Latest eye doctor: 2 years ago. No vision change. No lower extremity numbness or tingling. Has been having some right foot pain.    Objective: Filed Vitals:   10/24/11 1629  BP: 123/79  Pulse: 90    Physical Examination:   General appearance - alert, appears older than stated age, in no acute distress.  Mouth - mucous membranes moist, pharynx normal without lesions Neck - supple, fullness around thyroid, no nodules or tenderness Chest - clear to auscultation, no wheezes, rales or rhonchi, symmetric air entry Heart - normal rate, regular rhythm, 2/6 systolic murmur Abdomen - soft, nontender, nondistended, no masses or organomegaly Extremities - peripheral pulses  normal, no pedal edema, no clubbing or cyanosis Microfilament: normal sensation in lower extremities.

## 2011-10-26 ENCOUNTER — Telehealth: Payer: Self-pay | Admitting: Family Medicine

## 2011-10-26 NOTE — Telephone Encounter (Signed)
Called patient about hemoglobin result of 8.3. Hemoglobin in November 2012 was 11.4. She has uterine fibroids for which she was seen at [facility] hospital and is planning on getting Mirena. Currently she is on OCP's.  Patient has been feeling some dizziness and some fatigue. She also feels like she is breathing fast and has vague chest discomfort when she does tiring house work. It goes away after a few seconds of resting. Last she felt like this was yesterday. She has been taking her ferrex 150mg  bid every day. She has also been on loestrin OCP and will be finishing this month's pack on Sunday. Last time she had a period was 30 days ago. She denies any current vaginal, urinary or rectal bleeding.  She also reports feeling more weak and tired today after having had a low glucose of 52. She ate something and rested and felt better afterwards.   Precepted with Dr. [place] who recommended to ask patient to return to clinic tomorrow afternoon for a same day appointment.  - Since patient has been taking ferrex 150mg  bid, I am not sure what other oral treatment options to suggest.  - With symptoms of increased work of breathing and lightheadedness, she may need transfusion, which she might need admission for, if the short day hospital stay doesn't cover transfusions. - may consider for her to go directly from one OCP pack to the next without taking the placebo pills, so that patient doesn't have a period when she finishes her pack. - I will also later investigate any other sources of bleeding (rectal, urine...) - hypoglycemia: recommended for patient to go back to metformin 1000mg  bid and glipizide 5mg  instead of increasing glipizide to bid.   Patient agreed over the phone to coming in tomorrow for a visit. Patient's daughter interpreted from english to Urdu over the phone.

## 2011-10-27 ENCOUNTER — Ambulatory Visit (INDEPENDENT_AMBULATORY_CARE_PROVIDER_SITE_OTHER): Payer: Self-pay | Admitting: Family Medicine

## 2011-10-27 VITALS — BP 123/82 | HR 93 | Ht 61.5 in | Wt 171.9 lb

## 2011-10-27 DIAGNOSIS — D649 Anemia, unspecified: Secondary | ICD-10-CM

## 2011-10-27 NOTE — Patient Instructions (Signed)
Thank you for coming in today. Please follow up with the GYN doctors on the 8th.  See Dr. Gwenlyn Saran in 2-3 weeks.  Make sure you take your iron pills with orange juice on an empty stomach if possible.  We may need to give blood soon if we cannot get your bleeding stopped.  Call or go to the emergency room if you get worse, have trouble breathing, have chest pains, or palpitations.

## 2011-10-29 ENCOUNTER — Encounter: Payer: Self-pay | Admitting: Family Medicine

## 2011-10-29 NOTE — Progress Notes (Signed)
Renee Pope is a 46 y.o. female who presents to [facility] today for Anemia visit. Was recently seen by PCP for anemia due to heavy vaginal bleeding. Her CBC was checked and found to be decreased from around 10 to around 8-9. She was asked to visit with a work in provider today. The patient notes mild dizzyness. Vaginal bleeding is about the same. Pt is taking her iron medications and OCPs. She has a f/u visit with OBGYN on April 8th Monday. She denies any syncope, CP or palpitations.    PMH, SH reviewed: As above. Vaginal bleeding and anemia.  ROS as above otherwise neg. No Chest pain, palpitations, SOB, Fever, Chills, Abd pain, N/V/D.  Medications reviewed. Current Outpatient Prescriptions  Medication Sig Dispense Refill  . B Complex-C (SUPER B COMPLEX PO) Take 1 tablet by mouth daily.        . Coenzyme Q10 (CO Q-10) 200 MG CAPS Take 1 tablet by mouth daily.        . Cyanocobalamin (B-12) 1000 MCG CAPS Take 2 capsules by mouth daily.        Marland Kitchen esomeprazole (NEXIUM) 40 MG capsule Take 1 capsule (40 mg total) by mouth daily before breakfast.  30 capsule  3  . glipiZIDE (GLUCOTROL) 5 MG tablet Take 5 mg by mouth daily.        . hydrochlorothiazide (HYDRODIURIL) 25 MG tablet Take 25 mg by mouth daily.        . iron polysaccharides (FERREX 150) 150 MG capsule Take 150 mg by mouth 2 (two) times daily.        Marland Kitchen loratadine (CLARITIN) 10 MG tablet Take 10 mg by mouth daily.        Marland Kitchen losartan (COZAAR) 50 MG tablet Take 50 mg by mouth daily.      . metFORMIN (GLUCOPHAGE) 500 MG tablet Take 500 mg by mouth 2 (two) times daily with a meal. Two tablets by mouth twice a day       . naproxen (NAPROSYN) 500 MG tablet Take 500 mg by mouth 2 (two) times daily with a meal.        . norethindrone-ethinyl estradiol (MICROGESTIN,JUNEL,LOESTRIN) 1-20 MG-MCG tablet Take 1 tablet by mouth daily.  1 Package  5  . potassium gluconate 595 MG TABS Take 595 mg by mouth 2 (two) times daily.        . rosuvastatin (CRESTOR) 5 MG  tablet Take 1 tablet (5 mg total) by mouth daily.  30 tablet  1  . traMADol (ULTRAM) 50 MG tablet Take 50 mg by mouth every 6 (six) hours as needed. Maximum dose= 8 tablets per day         Exam:  BP 123/82  Pulse 93  Ht 5' 1.5" (1.562 m)  Wt 171 lb 14.4 oz (77.973 kg)  BMI 31.95 kg/m2  LMP 09/26/2011 Gen: Well NAD, not orthostatic.  Lungs: CTABL Nl WOB Heart: RRR no systolic murmur at RUSB soft.  Exts: Non edematous BL  LE, warm and well perfused.   Lab Results  Component Value Date   WBC 7.8 10/24/2011   HGB 8.3* 10/24/2011   HCT 28.1* 10/24/2011   MCV 67.5* 10/24/2011   PLT 341 10/24/2011

## 2011-10-29 NOTE — Assessment & Plan Note (Signed)
Lower than I would like. I do not think she needs a RBC transfusion today. She will f/u with GYN Monday where she will likely have a mirena placed. Perhaps we can avoid any further transfusion. Plan to continue current therapy and f/u with PCP in 2-3 weeks. Red flags discussed.

## 2011-10-30 ENCOUNTER — Ambulatory Visit (INDEPENDENT_AMBULATORY_CARE_PROVIDER_SITE_OTHER): Payer: Self-pay | Admitting: Advanced Practice Midwife

## 2011-10-30 ENCOUNTER — Encounter: Payer: Self-pay | Admitting: Advanced Practice Midwife

## 2011-10-30 VITALS — BP 120/76 | HR 97 | Temp 99.9°F | Ht 61.5 in | Wt 171.8 lb

## 2011-10-30 DIAGNOSIS — N920 Excessive and frequent menstruation with regular cycle: Secondary | ICD-10-CM

## 2011-10-30 NOTE — Progress Notes (Signed)
  Subjective:    Patient ID: Renee Pope, female    DOB: 31-Aug-1965, 46 y.o.   MRN: [id number]  HPI This is a 46 y.o. female who presents for followup visit to let us know what treatment plan she wanted to pursue.  She has most recently seen Dr Marice Potter who Wrote the Following:  Ms. Renee Pope is an Erdu-speaking lady who has known fibroids. She was placed on the OCP to control her bleeding. I was unable to ascertain, even with the language line and her English-speaking daughter, if this has helped. I offered her Mirena, [place], RATH. I gave her written information and expect that with the help of her daughter she can gain a good understanding of these options. I also made it clear that if the OCPs are working, then she can just stay with that treatment   She subsequently was noted to have a Hgb of 8.3, but did not require transfusion.  She states that she has been taking OCPs for 6 months.  She now bleeds only 2-3 days, soaking 1 pad every 4-6 hrs on those days. No intercycle bleeding.   Review of Systems As listed above    Objective:   Physical Exam  Constitutional: She is oriented to person, place, and time. She appears well-developed and well-nourished. No distress.  Pulmonary/Chest: Effort normal.  Neurological: She is alert and oriented to person, place, and time.  Skin: Skin is warm and dry.  Psychiatric: She has a normal mood and affect.    Deferred per patient request      Assessment & Plan:  A:  Menorrhagia, well controlled on OCPs       Anemia, secondary to above, probably   P:  Discussed options reviewed by Dr Marice Potter. This lady would like to stay on pills      Will refer her back to Dr Gwenlyn Saran  For follow up care

## 2011-10-30 NOTE — Patient Instructions (Signed)
Oral Contraception Information Oral contraceptives (OCs) are medicines taken to prevent pregnancy. OCs work by preventing the ovaries from releasing eggs. The hormones in OCs also cause the cervical mucus to thicken, preventing the sperm from entering the uterus. The hormones also cause the uterine lining to become thin, not allowing a fertilized egg to attach to the inside of the uterus. OCs are highly effective when taken exactly as prescribed. However, OCs do not prevent sexually transmitted diseases (STDs). Safe sex practices, such as using condoms along with the pill, can help prevent STDs.  Before taking the pill, you may have a physical exam and Pap test. Your caregiver may order blood tests that may be necessary. Your caregiver will make sure you are a good candidate for oral contraception. Discuss with your caregiver the possible side effects of the OC you may be prescribed. When starting an OC, it can take 2 to 3 months for the body to adjust to the changes in hormone levels in your body.  TYPES OF ORAL CONTRACEPTION  The combination pill. This pill contains estrogen and progestin (synthetic progesterone) hormones. The combination pill comes in either 21-day or 28-day packs. With 21-day packs, you do not take pills for 7 days after the last pill. With 28-day packs, the pill is taken every day. The last 7 pills are without hormones. Certain types of pills have more than 21 hormone-containing pills.   The minipill. This pill contains the progesterone hormone only. It is taken every day continuously. The minipill comes in packs of 91 pills. The first 84 pills contain the hormones, and the last 7 pills do not. The last 7 days are when you will have your menstrual period. You may experience irregular spotting.  ADVANTAGES  Decreases premenstrual symptoms.   Treats menstrual period cramps.   Regulates the menstrual cycle.   Decreases a heavy menstrual flow.   Treats acne.   Treats abnormal  uterine bleeding.   Treats chronic pelvic pain.   Treats polycystic ovarian syndrome.   Treats endometriosis.   Can be used as emergency contraception.  DISADVANTAGES OCs can be less effective if:  You forget to take the pill at the same time every day.   You have a stomach or intestinal disease that lessens the absorption of the pill.   You take OCs with other medicines that make OCs less effective.   You take expired OCs.   You forget to restart the pill on day 7, when using the packs of 21 pills.  Document Released: 09/30/2002 Document Revised: 06/29/2011 Document Reviewed: 11/16/2010 [facility] Patient Information 2012 [place], [place].Fibroids You have been diagnosed as having a fibroid. Fibroids are smooth muscle lumps (tumors) which can occur any place in a woman's body. They are usually in the womb (uterus). The most common problem (symptom) of fibroids is bleeding. Over time this may cause low red blood cells (anemia). Other symptoms include feelings of pressure and pain in the pelvis. The diagnosis (learning what is wrong) of fibroids is made by physical exam. Sometimes tests such as an ultrasound are used. This is helpful when fibroids are felt around the ovaries and to look for tumors. TREATMENT   Most fibroids do not need surgical or medical treatment. Sometimes a tissue sample (biopsy) of the lining of the uterus is done to rule out cancer. If there is no cancer and only a small amount of bleeding, the problem can be watched.   Hormonal treatment can improve the problem.   When  surgery is needed, it can consist of removing the fibroid. Vaginal birth may not be possible after the removal of fibroids. This depends on where they are and the extent of surgery. When pregnancy occurs with fibroids it is usually normal.   Your caregiver can help decide which treatments are best for you.  HOME CARE INSTRUCTIONS   Do not use aspirin as this may increase bleeding problems.   If  your periods (menses) are heavy, record the number of pads or tampons used per month. Bring this information to your caregiver. This can help them determine the best treatment for you.  SEEK IMMEDIATE MEDICAL CARE IF:  You have pelvic pain or cramps not controlled with medications, or experience a sudden increase in pain.   You have an increase of pelvic bleeding between and during menses.   You feel lightheaded or have fainting spells.   You develop worsening belly (abdominal) pain.  Document Released: 07/07/2000 Document Revised: 06/29/2011 Document Reviewed: 02/27/2008 [facility] Patient Information 2012 [place], [place].

## 2011-10-30 NOTE — Progress Notes (Signed)
Here for follow up for fibroids, c/o cough, chest pain with coughing, sore throat x 3 days with pain in chest when coughing just today.

## 2011-11-02 ENCOUNTER — Encounter: Payer: Self-pay | Admitting: Advanced Practice Midwife

## 2011-11-17 ENCOUNTER — Ambulatory Visit: Payer: Self-pay | Admitting: Family Medicine

## 2011-11-21 ENCOUNTER — Ambulatory Visit (INDEPENDENT_AMBULATORY_CARE_PROVIDER_SITE_OTHER): Payer: Self-pay | Admitting: Family Medicine

## 2011-11-21 ENCOUNTER — Encounter: Payer: Self-pay | Admitting: Family Medicine

## 2011-11-21 VITALS — BP 110/76 | HR 83 | Ht 61.5 in | Wt 171.0 lb

## 2011-11-21 DIAGNOSIS — E119 Type 2 diabetes mellitus without complications: Secondary | ICD-10-CM

## 2011-11-21 DIAGNOSIS — D259 Leiomyoma of uterus, unspecified: Secondary | ICD-10-CM

## 2011-11-21 DIAGNOSIS — R011 Cardiac murmur, unspecified: Secondary | ICD-10-CM

## 2011-11-21 DIAGNOSIS — D649 Anemia, unspecified: Secondary | ICD-10-CM

## 2011-11-21 LAB — GLUCOSE, CAPILLARY: Glucose-Capillary: 103 mg/dL — ABNORMAL HIGH (ref 70–99)

## 2011-11-21 LAB — IRON AND TIBC: Iron: <10 ug/dL — ABNORMAL LOW (ref 42–145)

## 2011-11-21 NOTE — Patient Instructions (Signed)
We are going to check some more lab work. I will call you with the results. I would also like to get an ultrasound of your heart to evaluate the heart murmur.  I will call you to see if we need to get a transfusion.

## 2011-11-21 NOTE — Progress Notes (Signed)
Patient ID: Renee Pope, female   DOB: 05/16/1966, 46 y.o.   MRN: [id number] Patient ID: Renee Pope    DOB: January 29, 1966, 46 y.o.   MRN: [id number] --- Subjective:  Renee Pope is a 46 y.o.female with h/o uterine fibroids and diabetes who presents for follow up on her anemia - anemia: hemoglobin was 8.3 at last visit. She has been feeling some dizziness with standing at times, not every day, but if she exerts herself too much with house work. She has also been feeling heart palpitations and some shortness of breath which gets better once she rests. She denies any chest pain. This has been occuring for the last 3 weeks or so. Her uterine bleeding occurs every month when she finishes contraceptive pill . Bleeding lasts 5-6 days: first 3 days: 5-6 pads, next last days: 1 pad. This is much improved to what it used to be one year ago. She was seen by GYN this month that recommended continuing course of OCP treatment. She has been taking ferrex faithfully.  Denies any hematuria. Denies any blood in stool. Denies any current active bleeding.  ROS: see HPI PMH: reviewed allergies and medications with patient Objective: Filed Vitals:   11/21/11 1527  BP: 110/76  Pulse: 83   Orthostatics:  Supine: 125/80 HR: 85 Sitting: 117/78 HR: 81 Standing: 110/76 HR: 83 Physical Examination:   General appearance - alert, well appearing, and appears tired Eyes - pale conjunctiva Neck - supple, no significant adenopathy Chest - clear to auscultation, no wheezes, rales or rhonchi, symmetric air entry Heart - normal rate, regular rhythm, normal S1, S2, 2/5 systolic ejection murmur at right sterna birder, Abdomen - soft, nontender, nondistended, no masses or organomegaly Extremities - peripheral pulses normal, no pedal edema, no clubbing or cyanosis Rectal - rectal exam performed: normal tone, no hemorrhoids, unable to obtain stool sample for hemeoccult

## 2011-11-21 NOTE — Assessment & Plan Note (Addendum)
POC hemoglobin was low at 7.4 compared to 8.3 last month. This is concerning since she states that she takes ferrex faithfully. Her pelvic bleeding seems to be much improved. The possibility for another reason for microcytic anemia therefore needs to be investigated. Rectal bleeding from colon cancer is part of the differential for her anemia. Performed rectal exam and could not obtain stool sample for guaiac. Therefore instructed patient to collect stool cards and return them to clinic as soon as possible.  Will repeat CBC, obtain ferritin, tibc and iron to assess iron stores. Patient was not quite orthostatic from supine to standing. Patient not having chest pain due to anemia. Patient may however need transfusion if CBC confirm 7.4 point of care and patient continues feeling fatigued and dizzy. Close follow up for Thursday or Friday of this week.

## 2011-11-22 LAB — CBC WITH DIFFERENTIAL/PLATELET
Basophils Absolute: 0 10*3/uL (ref 0.0–0.1)
Eosinophils Relative: 1 % (ref 0–5)
HCT: 28.9 % — ABNORMAL LOW (ref 36.0–46.0)
Hemoglobin: 8.2 g/dL — ABNORMAL LOW (ref 12.0–15.0)
Lymphocytes Relative: 30 % (ref 12–46)
Lymphs Abs: 2.6 10*3/uL (ref 0.7–4.0)
MCV: 67.4 fL — ABNORMAL LOW (ref 78.0–100.0)
Monocytes Absolute: 0.5 10*3/uL (ref 0.1–1.0)
Monocytes Relative: 6 % (ref 3–12)
Neutro Abs: 5.4 10*3/uL (ref 1.7–7.7)
RBC: 4.29 MIL/uL (ref 3.87–5.11)
RDW: 16.5 % — ABNORMAL HIGH (ref 11.5–15.5)
WBC: 8.5 10*3/uL (ref 4.0–10.5)

## 2011-11-22 LAB — FERRITIN: Ferritin: 3 ng/mL — ABNORMAL LOW (ref 10–291)

## 2011-11-22 NOTE — Assessment & Plan Note (Signed)
Persistent 2/6 systolic murmur best heard at right sternal border. This could be secondary to anemia, but will need to work this up further to exclude any valvular abnormality. Will get echo.

## 2011-11-22 NOTE — Assessment & Plan Note (Signed)
Plan to continue on OCP as they have been controlling her bleeding.

## 2011-11-22 NOTE — Assessment & Plan Note (Deleted)
Will address at next visit since anemia took full amount of time with patient.

## 2011-11-23 ENCOUNTER — Encounter: Payer: Self-pay | Admitting: Family Medicine

## 2011-11-23 ENCOUNTER — Ambulatory Visit (INDEPENDENT_AMBULATORY_CARE_PROVIDER_SITE_OTHER): Payer: Self-pay | Admitting: Family Medicine

## 2011-11-23 VITALS — BP 107/73 | HR 77 | Temp 98.3°F | Ht 62.0 in | Wt 173.6 lb

## 2011-11-23 DIAGNOSIS — R509 Fever, unspecified: Secondary | ICD-10-CM

## 2011-11-23 DIAGNOSIS — D649 Anemia, unspecified: Secondary | ICD-10-CM

## 2011-11-23 DIAGNOSIS — D259 Leiomyoma of uterus, unspecified: Secondary | ICD-10-CM

## 2011-11-23 LAB — COMPREHENSIVE METABOLIC PANEL
Albumin: 3.7 g/dL (ref 3.5–5.2)
BUN: 9 mg/dL (ref 6–23)
Calcium: 8.7 mg/dL (ref 8.4–10.5)
Chloride: 103 mEq/L (ref 96–112)
Glucose, Bld: 205 mg/dL — ABNORMAL HIGH (ref 70–99)
Potassium: 4.1 mEq/L (ref 3.5–5.3)

## 2011-11-23 LAB — POCT URINALYSIS DIPSTICK
Nitrite, UA: NEGATIVE
Protein, UA: NEGATIVE
Spec Grav, UA: <=1.005
Urobilinogen, UA: 0.2

## 2011-11-23 LAB — POCT UA - MICROSCOPIC ONLY

## 2011-11-23 NOTE — Assessment & Plan Note (Addendum)
Fever of 3 days without apparent source.  Will check CBC with diff, ESR, CMET, and urinalysis today.  Advised to follow-up on Monday, or sooner if new symptoms or worsening.  Further workup as indicated if fever still persists.  Update: CBC and sec rate normal.  Not likely serious infection or inflammatory disease.  Willcontinue to follow clinically

## 2011-11-23 NOTE — Progress Notes (Signed)
Addended by: [place], Minyon Billiter on: 11/23/2011 05:24 PM   Modules accepted: Orders

## 2011-11-23 NOTE — Progress Notes (Signed)
  Subjective:    Patient ID: Renee Pope, female    DOB: Oct 14, 1965, 46 y.o.   MRN: [id number]  HPI Fever and chills for 3 days- work in appointment  3 days of fever, noted value of 102 last night and this morning.  Having chills + Myalgias throguhout.   No cough or trouble breathing.  No abdominal pain.  No chest pain. No diarrhea, emesis, dizziness.   Anemia: Due to fibroids.  Last period 23-24 days ago.  Taking iron twice day.  Not currently bleeding.  Review of SystemsGeneral:  Positive for fever, chills, malaise, myalgias HEENT: Negative for conjunctivitis, ear pain or drainage, rhinorrhea, nasal congestion, sore throat Respiratory:  Negative for cough, sputum, dyspnea Abdomen: Negative for abdominal pain, emesis, diarrhea Skin:  Negative for rash  MSK:  No joint pain. No dysuria.        Objective:   Physical Exam  Interpreting performed by daughter.  GEN: Alert & Oriented, No acute distress HEENT: [place]/AT. EOMI, PERRLA, no conjunctival injection or scleral icterus.  Bilateral tympanic membranes intact without erythema or effusion.  .  Nares without edema or rhinorrhea.  Oropharynx is without erythema or exudates.  No anterior or posterior cervical lymphadenopathy. CV:  Regular Rate & Rhythm, no murmur Respiratory:  Normal work of breathing, CTAB Abd:  + BS, soft, no tenderness to palpation Ext: no pre-tibial edema Skin: no rash       Assessment & Plan:

## 2011-11-23 NOTE — Patient Instructions (Signed)
Take tylenol for fever and pain  Will check your anemia and urine today.  I will let you know if it is a concern.  Follow-up with PCP on Monday  If you have worsening or new symptoms, please seek medical care sooner.

## 2011-11-24 LAB — CBC WITH DIFFERENTIAL/PLATELET
Eosinophils Relative: 1 % (ref 0–5)
HCT: 28 % — ABNORMAL LOW (ref 36.0–46.0)
Hemoglobin: 8.1 g/dL — ABNORMAL LOW (ref 12.0–15.0)
Lymphocytes Relative: 32 % (ref 12–46)
MCHC: 28.9 g/dL — ABNORMAL LOW (ref 30.0–36.0)
MCV: 66.4 fL — ABNORMAL LOW (ref 78.0–100.0)
Monocytes Absolute: 0.4 10*3/uL (ref 0.1–1.0)
Monocytes Relative: 6 % (ref 3–12)
Neutro Abs: 4.2 10*3/uL (ref 1.7–7.7)
WBC: 6.9 10*3/uL (ref 4.0–10.5)

## 2011-11-24 LAB — SEDIMENTATION RATE: Sed Rate: 20 mm/hr (ref 0–22)

## 2011-11-24 NOTE — Assessment & Plan Note (Signed)
Hemoglobin stable 

## 2011-11-27 ENCOUNTER — Ambulatory Visit (INDEPENDENT_AMBULATORY_CARE_PROVIDER_SITE_OTHER): Payer: Self-pay | Admitting: Family Medicine

## 2011-11-27 ENCOUNTER — Encounter: Payer: Self-pay | Admitting: Family Medicine

## 2011-11-27 VITALS — BP 112/72 | HR 83 | Ht 62.0 in | Wt 172.0 lb

## 2011-11-27 DIAGNOSIS — R509 Fever, unspecified: Secondary | ICD-10-CM

## 2011-11-27 DIAGNOSIS — D649 Anemia, unspecified: Secondary | ICD-10-CM

## 2011-11-27 NOTE — Progress Notes (Signed)
  Subjective:    Patient ID: Renee Pope, female    DOB: 11-11-1965, 46 y.o.   MRN: [id number]  HPI Patient presents today for followup of fever. Patient was recently seen on May 2 for fever x3 days as well as generalized malaise fatigue. A relatively broad differential was obtained including a CBC and UA. Workup was essentially negative apart from chronic iron deficiency anemia. Patient presents today for followup of this. Today, patient states her fevers and generalized malaise and myalgias have resolved. Patient states that she's not able to do her daily activities with minimal symptoms. Patient is poor she has had some intermittent leg cramping otherwise no symptoms are reported.   Review of Systems See history of present illness, otherwise review systems negative.    Objective:   Physical Exam Gen: up in chair, NAD HEENT: NCAT, EOMI, TMs clear bilaterally, poor dentition. CV: RRR, no murmurs auscultated PULM: CTAB, no wheezes, rales, rhoncii ABD: S/NT/+ bowel sounds  EXT: 2+ peripheral pulses, calves symmetric, mild lower L gastrocs tenderness     Assessment & Plan:

## 2011-11-27 NOTE — Patient Instructions (Signed)
It was good to see you today  You likely had a virus that caused your symptoms If your leg cramps worsen, give [place] a call or go to the emergency room Call if any questions,  God Bless, Doree Albee MD

## 2011-11-27 NOTE — Assessment & Plan Note (Addendum)
Clinically stable. Continue iron. ? if pt would benefit from IV iron infusion.

## 2011-11-27 NOTE — Assessment & Plan Note (Signed)
Likely flu like illness. Clinically resolved. Will continue to follow.

## 2011-11-29 LAB — HEMOCCULT GUIAC POC 1CARD (OFFICE): Card #3 Fecal Occult Blood, POC: NEGATIVE

## 2011-11-29 NOTE — Progress Notes (Signed)
Addended by: [place], Elisha Cooksey on: 11/29/2011 04:11 PM   Modules accepted: Orders

## 2011-11-30 ENCOUNTER — Ambulatory Visit: Payer: Self-pay | Admitting: Family Medicine

## 2011-12-28 ENCOUNTER — Encounter: Payer: Self-pay | Admitting: Family Medicine

## 2011-12-28 ENCOUNTER — Ambulatory Visit (INDEPENDENT_AMBULATORY_CARE_PROVIDER_SITE_OTHER): Payer: Self-pay | Admitting: Family Medicine

## 2011-12-28 VITALS — BP 110/68 | HR 80 | Wt 172.9 lb

## 2011-12-28 DIAGNOSIS — D649 Anemia, unspecified: Secondary | ICD-10-CM

## 2011-12-28 DIAGNOSIS — I10 Essential (primary) hypertension: Secondary | ICD-10-CM

## 2011-12-28 DIAGNOSIS — E119 Type 2 diabetes mellitus without complications: Secondary | ICD-10-CM

## 2011-12-28 DIAGNOSIS — R011 Cardiac murmur, unspecified: Secondary | ICD-10-CM

## 2011-12-28 LAB — CBC WITH DIFFERENTIAL/PLATELET
Basophils Absolute: 0 10*3/uL (ref 0.0–0.1)
Basophils Relative: 0 % (ref 0–1)
Hemoglobin: 7.8 g/dL — ABNORMAL LOW (ref 12.0–15.0)
MCHC: 30.1 g/dL (ref 30.0–36.0)
Monocytes Relative: 5 % (ref 3–12)
Neutro Abs: 6.4 10*3/uL (ref 1.7–7.7)
Neutrophils Relative %: 62 % (ref 43–77)

## 2011-12-28 NOTE — Patient Instructions (Signed)
For the anemia, stop taking the brown pills of the pill pack. That way you will not have a period. I will call you when I get the results from the blood work. We will decide at that point, if we need to do a transfusion.  If you start having shortness of breath, pain in your chest, come back to the clinic.  For the dizziness, make sure you drink a lot of water.  For the diabetes, we are going to stop the glipizide. Continue taking the metformin.   You will be contacted on getting an echo for the heart.

## 2011-12-29 NOTE — Assessment & Plan Note (Addendum)
With reported episodes of hypoglycemia, will stop glyburide and continue metformin 1000mg  bid. Moreover, given period of fasting in July with Ramadan, risk of hypoglycemia is even higher.  A1C in April was 6.3. Will repeat A1C at her next follow up visit to monitor effects of medication adjustment.  Will also obtain ophthalmology referral for diabetic eye exam.

## 2011-12-29 NOTE — Assessment & Plan Note (Addendum)
Still having symptoms of fatigue.  Iron studies obtained showed iron deficiency anemia. Will obtain CBC and retic to assess level at this point.  Obtained fecal occult x 3 at home and they were all negative. Routine UA done recently did not show any hematuria either. It is therefore safe to assume that anemia is from uterine fibroids.  Recommended that patient not take the "empty" pills in the ocp packet and go straight to the active pills at the end of the month. This will hopefully stop anymore menstrual bleeding which is likely contributing to this. Will also see CBC results to assess need for IV iron. Low threshold for this especially in the setting of upcoming fasting with Ramadan in July.  Murmur likely from anemia, but will nonetheless obtain echo

## 2011-12-29 NOTE — Assessment & Plan Note (Signed)
Well controled at 110/68. Continue losartan 50 daily.

## 2011-12-29 NOTE — Progress Notes (Signed)
Patient ID: Renee Pope, female   DOB: [birth year], 46 y.o.   MRN: [id number] Patient ID: Renee Pope    DOB: 1965/07/30, 46 y.o.   MRN: [id number] --- Subjective:  Renee Pope is a 46 y.o.female who presents for follow up on anemia and diabetes.  History was obtained through urdu interpreter.  Anemia and uterine fibroids: Continues having episodes of fatigue every 2-3 days. When it occurs, it can happen up to 2-3 times a day. Most commonly when she does strenuous house work. Denies any chest pain, shortness of breath. Has some dizziness where she feels like the room is spinning, more when she gets up quickly. He gets better when she stabilizes herself walks a little bit or sits down. Takes her iron supplements on a daily basis and denies skipping any doses. Also takes birth control pills every day without missed doses. She describes having one to 2 days of bleeding on the hormone free pill days. Breathing is not as significant as it had been before she had been started on birth control pill. Ramadan is about to start in July and she would like for her anemia to be controlled before then since she will be not be eating throughout the day.  Diabetes: Has had some periods of low blood sugar is in the 40s to 50s. This happens once a week. She says that when this happened she feels shaky and weak and tired. She she eats something sweet and her blood sugar goes back up. She also says that her sugars have been in the 150-200 range. She does not check her sugars on a regular basis only when she feels different or sick. Feels like her vision has decreased in the last year or so but no acute vision change recently. Denies abdominal pain nausea vomiting polyuria polydipsia.   ROS: see HPI Past Medical History: reviewed and updated medications and allergies. Social History: Tobacco: denies use  Objective: Filed Vitals:   12/28/11 1636  BP: 110/68  Pulse: 80    Physical Examination:   General appearance -  alert, well appearing, and in no distress HEENT - pale conjunctiva, oropharynx clear, EOMI, PERRLA Neck - supple, no significant adenopathy Chest - clear to auscultation, no wheezes, rales or rhonchi, symmetric air entry Heart - normal rate, regular rhythm, 2/6 systolic ejection murmur unchanged from previous, no rubs, clicks or gallops Abdomen - soft, nontender, nondistended, no masses or organomegaly Extremities - peripheral pulses normal, no pedal edema, no clubbing or cyanosis

## 2012-01-01 ENCOUNTER — Telehealth: Payer: Self-pay | Admitting: Family Medicine

## 2012-01-01 ENCOUNTER — Other Ambulatory Visit: Payer: Self-pay | Admitting: Family Medicine

## 2012-01-01 NOTE — Telephone Encounter (Signed)
Called patient and spoke with her daughter who could translate for her. I asked her to make an appointment at the short stay center for the feraheme infusion. She agreed to call and make appointment.

## 2012-01-01 NOTE — Telephone Encounter (Signed)
Faxed a form for short stay with order for feraheme IV 510mg /24ml to administer in a single 17ml dose.

## 2012-01-02 ENCOUNTER — Telehealth: Payer: Self-pay | Admitting: *Deleted

## 2012-01-02 ENCOUNTER — Other Ambulatory Visit (HOSPITAL_COMMUNITY): Payer: Self-pay | Admitting: *Deleted

## 2012-01-02 NOTE — Telephone Encounter (Signed)
Called and informed patient's daughter of appointment for 2-D echo at [facility] on 01/16/12 at 10:am. Told her to arrive 15 minutes early to 1st floor admitting.Amauri Keefe, Rodena Medin

## 2012-01-05 ENCOUNTER — Inpatient Hospital Stay (HOSPITAL_COMMUNITY)
Admission: RE | Admit: 2012-01-05 | Discharge: 2012-01-05 | Payer: Self-pay | Source: Ambulatory Visit | Attending: Family Medicine | Admitting: Family Medicine

## 2012-01-05 ENCOUNTER — Ambulatory Visit (HOSPITAL_COMMUNITY)
Admission: RE | Admit: 2012-01-05 | Discharge: 2012-01-05 | Disposition: A | Discharge status: Home / Self Care | Payer: Self-pay | Source: Ambulatory Visit | Attending: Family Medicine | Admitting: Family Medicine

## 2012-01-05 DIAGNOSIS — D649 Anemia, unspecified: Secondary | ICD-10-CM | POA: Insufficient documentation

## 2012-01-05 MED ORDER — FERUMOXYTOL INJECTION 510 MG/17 ML
510.0000 mg | Freq: Once | INTRAVENOUS | Status: AC
  Administered 2012-01-05: 510 mg via INTRAVENOUS
  Filled 2012-01-05: qty 17

## 2012-01-05 MED ORDER — SODIUM CHLORIDE 0.9 % IV SOLN
INTRAVENOUS | Status: DC
  Administered 2012-01-05: 12:00:00 via INTRAVENOUS

## 2012-01-10 ENCOUNTER — Encounter: Payer: Self-pay | Admitting: Family Medicine

## 2012-01-10 ENCOUNTER — Ambulatory Visit (INDEPENDENT_AMBULATORY_CARE_PROVIDER_SITE_OTHER): Payer: Self-pay | Admitting: Family Medicine

## 2012-01-10 VITALS — BP 107/68 | HR 87 | Temp 98.2°F | Ht 62.0 in | Wt 173.0 lb

## 2012-01-10 DIAGNOSIS — R52 Pain, unspecified: Secondary | ICD-10-CM | POA: Insufficient documentation

## 2012-01-10 NOTE — Patient Instructions (Signed)
Continue acetaminophen and naproxen for pain  Use warm soaks and baths and massage for pain.  Make appointment with Dr. Gwenlyn Saran for next available.

## 2012-01-10 NOTE — Assessment & Plan Note (Signed)
Does not seem related to iron transfusion.  Most highly concerned for depression or fibromyalgia.  She admits to sadness and anxiety but is not accepting of need for treatment at this time as she feels like she is sad only because of pain.   Advised to co ntinue acetaminophen, naprosyn, and add warm soaks for muscle relaxation. Will check CBC, TSH, CK today.  Will follow-up with PCP at next available for further evaluation  for possible depression or fibromyalgia.

## 2012-01-10 NOTE — Progress Notes (Signed)
  Subjective:    Patient ID: Renee Pope, female    DOB: April 14, 1966, 46 y.o.   MRN: [id number]  HPI Work in appt for body aches.  Here with her daughter and  Official interpreter.  Received ferriheme infusion on 6/14 for iron deficiency anemia related to uterine fibroids.  States several hours later, started to have whole body aches.  States all over body,  No area worse than other.  Arms, legs.  Over th past few days, body aches have been the same.   Takes tylenol 500 mg 2-3 times per day with some improvement.  Also took naproxen 500 mg 2-3 times per day with some improvement.   Also having headaches. For 2 days which patient states almost resolved.  States lips feel tingling.  Feels nervous or anxious since Friday.  Sometimes sadness.  States she has a lot of stress in her life.  Feels tired.    Review of Systems No numbness, weakness, vision changes, dizziness.    Objective:   Physical Exam GEN: Alert & Oriented, No acute distress, flat affect CV:  Regular Rate & Rhythm, no murmur Respiratory:  Normal work of breathing, CTAB Abd:  + BS, soft, no tenderness to palpation Ext: no pre-tibial edema MSK;  Pain out of proportion to exam when lightly palpating trapezius, arms, legs.  flinches to muscle palpation delayed.         Assessment & Plan:

## 2012-01-11 LAB — CBC
HCT: 29.2 % — ABNORMAL LOW (ref 36.0–46.0)
Hemoglobin: 8.6 g/dL — ABNORMAL LOW (ref 12.0–15.0)
MCH: 20.1 pg — ABNORMAL LOW (ref 26.0–34.0)
MCHC: 29.5 g/dL — ABNORMAL LOW (ref 30.0–36.0)
RBC: 4.27 MIL/uL (ref 3.87–5.11)

## 2012-01-12 ENCOUNTER — Telehealth: Payer: Self-pay | Admitting: Family Medicine

## 2012-01-12 NOTE — Telephone Encounter (Signed)
Called patient and spoke with her niece. She had an IV infusion of ferraheme on 01/05/12. Needs another infusion, about 8 days later. Asked her niece to make an appointment for this coming Monday. Faxed an order to the short stay section B for feraheme 150mg /24ml IV x1. Will recheck ferritin, transferrin, iron and Hgb in 1 month after this second infusion.

## 2012-01-16 ENCOUNTER — Other Ambulatory Visit: Payer: Self-pay | Admitting: Family Medicine

## 2012-01-16 ENCOUNTER — Encounter: Payer: Self-pay | Admitting: Family Medicine

## 2012-01-16 ENCOUNTER — Ambulatory Visit (HOSPITAL_COMMUNITY)
Admission: RE | Admit: 2012-01-16 | Discharge: 2012-01-16 | Disposition: A | Discharge status: Home / Self Care | Payer: Self-pay | Source: Ambulatory Visit | Attending: Family Medicine | Admitting: Family Medicine

## 2012-01-16 DIAGNOSIS — I10 Essential (primary) hypertension: Secondary | ICD-10-CM | POA: Insufficient documentation

## 2012-01-16 DIAGNOSIS — E119 Type 2 diabetes mellitus without complications: Secondary | ICD-10-CM | POA: Insufficient documentation

## 2012-01-16 DIAGNOSIS — D509 Iron deficiency anemia, unspecified: Secondary | ICD-10-CM

## 2012-01-16 DIAGNOSIS — R011 Cardiac murmur, unspecified: Secondary | ICD-10-CM | POA: Insufficient documentation

## 2012-01-16 DIAGNOSIS — E785 Hyperlipidemia, unspecified: Secondary | ICD-10-CM | POA: Insufficient documentation

## 2012-01-16 DIAGNOSIS — K219 Gastro-esophageal reflux disease without esophagitis: Secondary | ICD-10-CM | POA: Insufficient documentation

## 2012-01-16 DIAGNOSIS — I059 Rheumatic mitral valve disease, unspecified: Secondary | ICD-10-CM | POA: Insufficient documentation

## 2012-01-16 NOTE — Progress Notes (Signed)
  Echocardiogram 2D Echocardiogram has been performed.  Renee Pope 01/16/2012, 10:40 AM

## 2012-01-17 ENCOUNTER — Other Ambulatory Visit (HOSPITAL_COMMUNITY): Payer: Self-pay | Admitting: *Deleted

## 2012-01-17 MED ORDER — SODIUM CHLORIDE 0.9 % IV SOLN: Freq: Once | INTRAVENOUS | Status: DC

## 2012-01-17 MED ORDER — FERUMOXYTOL INJECTION 510 MG/17 ML: 510.0000 mg | Freq: Once | INTRAVENOUS | Status: DC

## 2012-01-18 ENCOUNTER — Encounter (HOSPITAL_COMMUNITY)
Admission: RE | Admit: 2012-01-18 | Discharge: 2012-01-18 | Disposition: A | Discharge status: Home / Self Care | Payer: Self-pay | Source: Ambulatory Visit | Attending: Family Medicine | Admitting: Family Medicine

## 2012-01-18 DIAGNOSIS — D509 Iron deficiency anemia, unspecified: Secondary | ICD-10-CM | POA: Insufficient documentation

## 2012-01-18 MED ORDER — FERUMOXYTOL INJECTION 510 MG/17 ML
510.0000 mg | Freq: Once | INTRAVENOUS | Status: AC
  Administered 2012-01-18: 510 mg via INTRAVENOUS
  Filled 2012-01-18: qty 17

## 2012-01-18 MED ORDER — SODIUM CHLORIDE 0.9 % IV SOLN
Freq: Once | INTRAVENOUS | Status: AC
  Administered 2012-01-18: 09:00:00 via INTRAVENOUS

## 2012-01-23 ENCOUNTER — Ambulatory Visit (INDEPENDENT_AMBULATORY_CARE_PROVIDER_SITE_OTHER): Payer: Self-pay | Admitting: Family Medicine

## 2012-01-23 ENCOUNTER — Encounter: Payer: Self-pay | Admitting: Family Medicine

## 2012-01-23 VITALS — BP 106/70 | HR 83 | Ht 62.0 in | Wt 172.0 lb

## 2012-01-23 DIAGNOSIS — K219 Gastro-esophageal reflux disease without esophagitis: Secondary | ICD-10-CM

## 2012-01-23 DIAGNOSIS — E119 Type 2 diabetes mellitus without complications: Secondary | ICD-10-CM

## 2012-01-23 DIAGNOSIS — D509 Iron deficiency anemia, unspecified: Secondary | ICD-10-CM

## 2012-01-23 DIAGNOSIS — D649 Anemia, unspecified: Secondary | ICD-10-CM

## 2012-01-23 DIAGNOSIS — R011 Cardiac murmur, unspecified: Secondary | ICD-10-CM

## 2012-01-23 DIAGNOSIS — J309 Allergic rhinitis, unspecified: Secondary | ICD-10-CM

## 2012-01-23 DIAGNOSIS — I10 Essential (primary) hypertension: Secondary | ICD-10-CM

## 2012-01-23 DIAGNOSIS — E785 Hyperlipidemia, unspecified: Secondary | ICD-10-CM

## 2012-01-23 MED ORDER — ROSUVASTATIN CALCIUM 5 MG PO TABS: 5.0000 mg | ORAL_TABLET | Freq: Every day | ORAL | Status: DC

## 2012-01-23 MED ORDER — POLYSACCHARIDE IRON COMPLEX 150 MG PO CAPS: 150.0000 mg | ORAL_CAPSULE | Freq: Two times a day (BID) | ORAL | Status: DC

## 2012-01-23 MED ORDER — LORATADINE 10 MG PO TABS: 10.0000 mg | ORAL_TABLET | Freq: Every day | ORAL | Status: DC

## 2012-01-23 MED ORDER — ESOMEPRAZOLE MAGNESIUM 40 MG PO CPDR: 40.0000 mg | DELAYED_RELEASE_CAPSULE | Freq: Every day | ORAL | Status: AC

## 2012-01-23 MED ORDER — LOSARTAN POTASSIUM 50 MG PO TABS: 50.0000 mg | ORAL_TABLET | Freq: Every day | ORAL | Status: DC

## 2012-01-23 NOTE — Assessment & Plan Note (Signed)
Echo obtained June 2013. No structural abnormality. Likely from anemia. Not heard on exam today

## 2012-01-23 NOTE — Progress Notes (Signed)
Patient ID: Renee Pope, female   DOB: [birth year], 46 y.o.   MRN: [id number] Patient ID: Renee Pope    DOB: [birth year], 46 y.o.   MRN: [id number] --- Subjective:  Renee Pope is a 46 y.o.female who presents for follow up on anemia and diabetes. History obtained via in person interpreter 1. Iron deficiency anemia: tolerated second dose of IV iron without trouble. Feeling more energy. Not as fatigued as previous. Bleeding stable. Ran out of ferrex 3 days ago. Has been taking birth control pill taking 2 pills of the placebo week instead of the full week. No shortness of breath, no chest pain.  2. Diabetes:  Meds: glipizide stopped last visit due to hypoglycemia. Metformin 1000mg  bid.  A1C today: 5.4  CBG's: 85-208 (post prandial). No episodes of hypoglycemia. No change in vision.   ROS: see HPI Past Medical History: reviewed and updated medications and allergies. Social History: Tobacco: denies  Objective: Filed Vitals:   01/23/12 1049  BP: 106/70  Pulse: 83    Physical Examination:   General appearance - alert, well appearing, and in no distress Eyes: pale conjunctiva Chest - clear to auscultation, no wheezes, rales or rhonchi, symmetric air entry Heart - normal rate, regular rhythm, normal S1, S2, no murmurs, rubs, clicks or gallops Abdomen - soft, nontender, nondistended, no masses or organomegaly Extremities - peripheral pulses normal, no pedal edema, no clubbing or cyanosis, normal sensation to light touch in both feet

## 2012-01-23 NOTE — Patient Instructions (Addendum)
Please come back for lab work after Ramadan.  Your A1C was 5.4 which is good. Do not take the glipizide anymore.  I will see you back in 2 months.

## 2012-01-23 NOTE — Assessment & Plan Note (Signed)
Controled. Continue current management 

## 2012-01-23 NOTE — Assessment & Plan Note (Signed)
Well controled on metformin 1000mg  bid. A1C: 5.4. No report of hypoglycemia afer d/ceing glipizide. Will continue at current metformin dose and adjust if patient starts having hypoglycemic episodes.

## 2012-01-23 NOTE — Assessment & Plan Note (Addendum)
S/p 2 IV infusio of ferreheme. Last infusion on 06/24. Will need repeat CBC, retic, ferritin and TIBC in ~ 73month after infusion. With ramadan, patient will come for lab early august. Asymptomatic at this time.

## 2012-03-05 ENCOUNTER — Other Ambulatory Visit: Payer: Self-pay

## 2012-03-05 DIAGNOSIS — D509 Iron deficiency anemia, unspecified: Secondary | ICD-10-CM

## 2012-03-05 LAB — IRON AND TIBC
%SAT: 10 % — ABNORMAL LOW (ref 20–55)
Iron: 41 ug/dL — ABNORMAL LOW (ref 42–145)
UIBC: 352 ug/dL (ref 125–400)

## 2012-03-05 LAB — CBC WITH DIFFERENTIAL/PLATELET
Basophils Absolute: 0 10*3/uL (ref 0.0–0.1)
Lymphocytes Relative: 30 % (ref 12–46)
Lymphs Abs: 1.9 10*3/uL (ref 0.7–4.0)
Neutro Abs: 4 10*3/uL (ref 1.7–7.7)
Platelets: 295 10*3/uL (ref 150–400)
RBC: 4.54 MIL/uL (ref 3.87–5.11)
RDW: 21.5 % — ABNORMAL HIGH (ref 11.5–15.5)
WBC: 6.4 10*3/uL (ref 4.0–10.5)

## 2012-03-05 LAB — RETICULOCYTES
ABS Retic: 31.8 10*3/uL (ref 19.0–186.0)
RBC.: 4.54 MIL/uL (ref 3.87–5.11)
Retic Ct Pct: 0.7 % (ref 0.4–2.3)

## 2012-03-05 NOTE — Progress Notes (Signed)
CBC WITH DIFF,RETIC,IBC AND FERRITIN DONE TODAY Renee Pope

## 2012-03-19 ENCOUNTER — Telehealth: Payer: Self-pay | Admitting: Family Medicine

## 2012-03-19 MED ORDER — METFORMIN HCL 500 MG PO TABS: 1000.0000 mg | ORAL_TABLET | Freq: Two times a day (BID) | ORAL | Status: AC

## 2012-03-19 NOTE — Telephone Encounter (Signed)
Called and informed patient Rx sent to pharmacy.[place], [place]  

## 2012-03-19 NOTE — Telephone Encounter (Signed)
Forward to PCP for refill request 

## 2012-03-19 NOTE — Telephone Encounter (Signed)
Sent refill for metformin 1000mg  bid.

## 2012-03-19 NOTE — Telephone Encounter (Signed)
Pt states that the pharmacy said that she needed to contact doctor about refilling Metformin.  She is now out [facility] [address] [facility] [place]

## 2012-03-28 ENCOUNTER — Telehealth: Payer: Self-pay | Admitting: Family Medicine

## 2012-03-28 NOTE — Telephone Encounter (Signed)
Caregiver is calling to ask questions about her eye doctor appt.  She wants to know if she can [place] because [place].

## 2012-04-05 NOTE — Telephone Encounter (Signed)
Has this been addressed?

## 2012-04-05 NOTE — Telephone Encounter (Signed)
Left message for patient to return call. She may order her glasses from wherever she feels comfortable.[place], Rodena Medin

## 2012-06-17 ENCOUNTER — Encounter: Payer: Self-pay | Admitting: Family Medicine

## 2012-06-17 ENCOUNTER — Ambulatory Visit (INDEPENDENT_AMBULATORY_CARE_PROVIDER_SITE_OTHER): Payer: Self-pay | Admitting: Family Medicine

## 2012-06-17 VITALS — BP 114/78 | HR 75 | Ht 62.0 in | Wt 176.0 lb

## 2012-06-17 DIAGNOSIS — E119 Type 2 diabetes mellitus without complications: Secondary | ICD-10-CM

## 2012-06-17 DIAGNOSIS — E785 Hyperlipidemia, unspecified: Secondary | ICD-10-CM

## 2012-06-17 DIAGNOSIS — D509 Iron deficiency anemia, unspecified: Secondary | ICD-10-CM

## 2012-06-17 DIAGNOSIS — D649 Anemia, unspecified: Secondary | ICD-10-CM

## 2012-06-17 LAB — CBC WITH DIFFERENTIAL/PLATELET
Eosinophils Absolute: 0.1 10*3/uL (ref 0.0–0.7)
Eosinophils Relative: 2 % (ref 0–5)
Lymphs Abs: 2.3 10*3/uL (ref 0.7–4.0)
MCH: 26.8 pg (ref 26.0–34.0)
MCHC: 32.9 g/dL (ref 30.0–36.0)
MCV: 81.5 fL (ref 78.0–100.0)
Platelets: 322 10*3/uL (ref 150–400)
RDW: 12.8 % (ref 11.5–15.5)

## 2012-06-17 LAB — BASIC METABOLIC PANEL
BUN: 9 mg/dL (ref 6–23)
CO2: 23 mEq/L (ref 19–32)
Chloride: 103 mEq/L (ref 96–112)
Glucose, Bld: 138 mg/dL — ABNORMAL HIGH (ref 70–99)
Potassium: 4.5 mEq/L (ref 3.5–5.3)

## 2012-06-17 MED ORDER — POLYSACCHARIDE IRON COMPLEX 150 MG PO CAPS: 150.0000 mg | ORAL_CAPSULE | Freq: Two times a day (BID) | ORAL | Status: AC

## 2012-06-17 MED ORDER — ROSUVASTATIN CALCIUM 5 MG PO TABS: 5.0000 mg | ORAL_TABLET | Freq: Every day | ORAL | Status: AC

## 2012-06-17 MED ORDER — NORETHINDRONE ACET-ETHINYL EST 1-20 MG-MCG PO TABS: 1.0000 | ORAL_TABLET | Freq: Every day | ORAL | Status: DC

## 2012-06-17 NOTE — Assessment & Plan Note (Signed)
Patient reports good control of periods. Will check CBC to make sure she is not anemic again. Refilled ferrex

## 2012-06-17 NOTE — Patient Instructions (Addendum)
Continue taking your metformin 1000mg  twice daily. To avoid the low sugars, it is important smaller meals mor often during the day.    Diabetes Meal Planning Guide The diabetes meal planning guide is a tool to help you plan your meals and snacks. It is important for people with diabetes to manage their blood glucose (sugar) levels. Choosing the right foods and the right amounts throughout your day will help control your blood glucose. Eating right can even help you improve your blood pressure and reach or maintain a healthy weight. CARBOHYDRATE COUNTING MADE EASY When you eat carbohydrates, they turn to sugar. This raises your blood glucose level. Counting carbohydrates can help you control this level so you feel better. When you plan your meals by counting carbohydrates, you can have more flexibility in what you eat and balance your medicine with your food intake. Carbohydrate counting simply means adding up the total amount of carbohydrate grams in your meals and snacks. Try to eat about the same amount at each meal. Foods with carbohydrates are listed below. Each portion below is 1 carbohydrate serving or 15 grams of carbohydrates. Ask your dietician how many grams of carbohydrates you should eat at each meal or snack. Grains and Starches  1 slice bread.   English muffin or hotdog/hamburger bun.   cup cold cereal (unsweetened).   cup cooked pasta or rice.   cup starchy vegetables (corn, potatoes, peas, beans, winter squash).  1 tortilla (6 inches).   bagel.  1 waffle or pancake (size of a CD).   cup cooked cereal.  4 to 6 small crackers. *Whole grain is recommended. Fruit  1 cup fresh unsweetened berries, melon, papaya, pineapple.  1 small fresh fruit.   banana or mango.   cup fruit juice (4 oz unsweetened).   cup canned fruit in natural juice or water.  2 tbs dried fruit.  12 to 15 grapes or cherries. Milk and Yogurt  1 cup fat-free or 1% milk.  1 cup soy  milk.  6 oz light yogurt with sugar-free sweetener.  6 oz low-fat soy yogurt.  6 oz plain yogurt. Vegetables  1 cup raw or  cup cooked is counted as 0 carbohydrates or a "free" food.  If you eat 3 or more servings at 1 meal, count them as 1 carbohydrate serving. Other Carbohydrates   oz chips or pretzels.   cup ice cream or frozen yogurt.   cup sherbet or sorbet.  2 inch square cake, no frosting.  1 tbs honey, sugar, jam, jelly, or syrup.  2 small cookies.  3 squares of graham crackers.  3 cups popcorn.  6 crackers.  1 cup broth-based soup.  Count 1 cup casserole or other mixed foods as 2 carbohydrate servings.  Foods with less than 20 calories in a serving may be counted as 0 carbohydrates or a "free" food. You may want to purchase a book or computer software that lists the carbohydrate gram counts of different foods. In addition, the nutrition facts panel on the labels of the foods you eat are a good source of this information. The label will tell you how big the serving size is and the total number of carbohydrate grams you will be eating per serving. Divide this number by 15 to obtain the number of carbohydrate servings in a portion. Remember, 1 carbohydrate serving equals 15 grams of carbohydrate. SERVING SIZES Measuring foods and serving sizes helps you make sure you are getting the right amount of food. The  list below tells how big or small some common serving sizes are.  1 oz.........4 stacked dice.  3 oz........Marland KitchenDeck of cards.  1 tsp.......Marland KitchenTip of little finger.  1 tbs......Marland KitchenMarland KitchenThumb.  2 tbs.......Marland KitchenGolf ball.   cup......Marland KitchenHalf of a fist.  1 cup.......Marland KitchenA fist. SAMPLE DIABETES MEAL PLAN Below is a sample meal plan that includes foods from the grain and starches, dairy, vegetable, fruit, and meat groups. A dietician can individualize a meal plan to fit your calorie needs and tell you the number of servings needed from each food group. However,  controlling the total amount of carbohydrates in your meal or snack is more important than making sure you include all of the food groups at every meal. You may interchange carbohydrate containing foods (dairy, starches, and fruits). The meal plan below is an example of a 2000 calorie diet using carbohydrate counting. This meal plan has 17 carbohydrate servings. Breakfast  1 cup oatmeal (2 carb servings).   cup light yogurt (1 carb serving).  1 cup blueberries (1 carb serving).   cup almonds. Snack  1 large apple (2 carb servings).  1 low-fat string cheese stick. Lunch  Chicken breast salad.  1 cup spinach.   cup chopped tomatoes.  2 oz chicken breast, sliced.  2 tbs low-fat [place] dressing.  12 whole-wheat crackers (2 carb servings).  12 to 15 grapes (1 carb serving).  1 cup low-fat milk (1 carb serving). Snack  1 cup carrots.   cup hummus (1 carb serving). Dinner  3 oz broiled salmon.  1 cup brown rice (3 carb servings). Snack  1  cups steamed broccoli (1 carb serving) drizzled with 1 tsp olive oil and lemon juice.  1 cup light pudding (2 carb servings).

## 2012-06-17 NOTE — Progress Notes (Signed)
Patient ID: Renee Pope    DOB: [birth year], 46 y.o.   MRN: [id number] --- Subjective:  Renee Pope is a 46 y.o.female with h/o DM2, HTN, iron deficiency anemia who presents for follow up on diabetes.  History was obtained with aid of an Urdu interpreter. - DM: patient feels like her sugars have been fluctuating. She reports episodes of low CBG's in the 60 and 70's where she feels shaky in her legs and weak overall. These episodes occur once a week to every 2-3 days. She thinks they might occur more when she goes a long time period between meals. When she has these episodes, she drinks milk which helps bring sugar up.  She also reports more elevated CBG's in the 200-250's sometimes fasting, sometimes not. During those times she feels like a "sugar rush" to her head.  Patient takes metformin 1000mg  bid. Glipizide was stopped at her last visit for concern of hypoglycemia.  She reports eating vegetables but mostly Naan, milk and tea, as she is trying to follow a special diabetic diet.  She denies any blurred vision, any loss of sensation in lower extremities.   ROS: see HPI Past Medical History: reviewed and updated medications and allergies. Social History: Tobacco: none  Objective: Filed Vitals:   06/17/12 1051  BP: 114/78  Pulse: 75    Physical Examination:   General appearance - alert, well appearing, and in no distress Eyes - pink conjunctiva Mouth - mucous membranes moist, pharynx normal without lesions Chest - clear to auscultation, no wheezes, rales or rhonchi, symmetric air entry Heart - normal rate, regular rhythm, normal S1, S2, 2/6 systolic murmur.  Abdomen - soft, nontender, nondistended, no masses or organomegaly Extremities - peripheral pulses normal, no pedal edema  Diabetic Foot exam: normal sensation with microfilament, small calluces on heels R>L. No ulcers or sores.

## 2012-06-17 NOTE — Assessment & Plan Note (Addendum)
A1C today at 6.5 compared to 5.4 in July 2013. Currently on metformin 1000mg  bid. Was also on glipizide which was stopped due to concerns of hypoglycemia. Currently at goal A1C. Reassured patient that the 200-250's she is sometimes reading are ok. Reviewed taking hard candy or juice (although patient doesn't like juice) in case of hypoglycemia. Also recommended that she eat more frequent meals, as it appears that the hypoglycemic episodes occur when she goes for 5-6 hours between meals.   Also reviewed types of foods to eat more of with diabetes.

## 2012-06-18 ENCOUNTER — Encounter: Payer: Self-pay | Admitting: Family Medicine

## 2012-09-10 ENCOUNTER — Other Ambulatory Visit: Payer: Self-pay | Admitting: Family Medicine

## 2012-09-18 ENCOUNTER — Ambulatory Visit: Payer: Self-pay | Admitting: Family Medicine

## 2012-09-18 ENCOUNTER — Ambulatory Visit (INDEPENDENT_AMBULATORY_CARE_PROVIDER_SITE_OTHER): Payer: No Typology Code available for payment source | Admitting: Family Medicine

## 2012-09-18 ENCOUNTER — Encounter: Payer: Self-pay | Admitting: Family Medicine

## 2012-09-18 VITALS — BP 138/82 | HR 83 | Temp 98.7°F | Ht 66.0 in | Wt 176.9 lb

## 2012-09-18 DIAGNOSIS — R238 Other skin changes: Secondary | ICD-10-CM | POA: Insufficient documentation

## 2012-09-18 DIAGNOSIS — M25519 Pain in unspecified shoulder: Secondary | ICD-10-CM | POA: Insufficient documentation

## 2012-09-18 DIAGNOSIS — D649 Anemia, unspecified: Secondary | ICD-10-CM

## 2012-09-18 DIAGNOSIS — M25569 Pain in unspecified knee: Secondary | ICD-10-CM | POA: Insufficient documentation

## 2012-09-18 DIAGNOSIS — E119 Type 2 diabetes mellitus without complications: Secondary | ICD-10-CM

## 2012-09-18 DIAGNOSIS — L989 Disorder of the skin and subcutaneous tissue, unspecified: Secondary | ICD-10-CM

## 2012-09-18 LAB — POCT SKIN KOH: Skin KOH, POC: NEGATIVE

## 2012-09-18 MED ORDER — FERROUS SULFATE 325 (65 FE) MG PO TABS: 325.0000 mg | ORAL_TABLET | Freq: Two times a day (BID) | ORAL | Status: AC

## 2012-09-18 MED ORDER — TRAMADOL HCL 50 MG PO TABS: 50.0000 mg | ORAL_TABLET | Freq: Three times a day (TID) | ORAL | Status: AC | PRN

## 2012-09-18 MED ORDER — NORETHINDRONE ACET-ETHINYL EST 1-20 MG-MCG PO TABS: 1.0000 | ORAL_TABLET | Freq: Every day | ORAL | Status: AC

## 2012-09-18 MED ORDER — GLIPIZIDE 5 MG PO TABS: 5.0000 mg | ORAL_TABLET | Freq: Every day | ORAL | Status: AC

## 2012-09-18 NOTE — Addendum Note (Signed)
Addended by: [place], Skylan Lara on: 09/18/2012 03:09 PM   Modules accepted: Orders

## 2012-09-18 NOTE — Patient Instructions (Addendum)
For the shoulder pain, do the exercises we talked about.  For the leg pain, you can take the new medicine I sent called tramadol.   Make an appointment with me for removal of skin tag.

## 2012-09-18 NOTE — Assessment & Plan Note (Signed)
Lower leg pain. No swelling and no evidence of DVT. No pain currently. Normal perfusion. Worst with sitting rather than walking goes against PVD. Electrolytes in November within normal limits. Likely muscle aches from standing all day. Aleve or tramadol if pain not controled with aleve.

## 2012-09-18 NOTE — Assessment & Plan Note (Signed)
A1C: 6.9 from 6.5. Patient concerned about elevated Blood sugar. Will add back glipizide and monitor episodes of low sugar. Recommended checking sugars.  Also discussed decreasing amount of Naan bread, potatoes and rice.  Trouble with paying medicine. Gave info about MAP.

## 2012-09-18 NOTE — Assessment & Plan Note (Signed)
Underlying area looks like acanthosis nigricans, but will obtain slide to rule out tinea versicolor.  Skin tag with broader base is irritated. Differential for base of skin tag include: seborrheic keratosis vs skin tag. Patient to make appointment for skin tag removal and punch biopsy of area.

## 2012-09-18 NOTE — Assessment & Plan Note (Signed)
Bilateral. Likely from arthritis or rotator cuff. No evidence of frozen shoulder, cervical spine impingement. Gave shoulder strengthening exercises. Tramadol for pain

## 2012-09-18 NOTE — Progress Notes (Signed)
Patient ID: Renee Pope    DOB: [birth year], 47 y.o.   MRN: [id number] --- Subjective:  Renee Pope is a 47 y.o.female who presents for follow up.  - itching and burning on left side of neck. X 6 months. Worst in the last few weeks with more itching and burning. No bleeding.  - pain in leg at night: achy pain. No tingling no burning. Worst with sitting and laying down. Better with walking. X few months. Takes naproxen 500mg  which helps a little. No weakness.  - shoulder pain: both shoulders. Worst after working in [employer]. Intermittent, not every day. A few months.  - DM2: feels shakiness 2 times in 1 month. did not check sugar at the time. Better with juice or sugar.  Takes metformin 1000mg  bid. No tingling in lower extremities. Vision ok with glasses  ROS: see HPI Past Medical History: reviewed and updated medications and allergies. Social History: Tobacco: denies  Objective: Filed Vitals:   09/18/12 1045  BP: 138/82  Pulse: 83  Temp: 98.7 F (37.1 C)    Physical Examination:   General appearance - alert, well appearing, and in no distress Chest - clear to auscultation, no wheezes, rales or rhonchi, symmetric air entry Heart - normal rate, regular rhythm, normal S1, S2, 3/6 systolic murmur Skin - area of hyperpigmentation along neck, skin tag on left side with underlying ~1cm hyperpigmented base.   Extremities - peripheral pulses normal, no pedal edema, no tenderness along gastroc muscles. Normal range of motion of knee bilaterally with some crepitus with flexion and extension.  Shoulders - negative empty can, negative Appley's scratch, normal range of motion, no cervical spine tenderness, no trapezius muscle tenderness. 5/5 strength in upper extremities bilaterally

## 2012-09-18 NOTE — Assessment & Plan Note (Signed)
Difficulty paying for ferrex. Will switch to ferrous sulfate bid to see if this is more affordable, although elemental iron amount will be less. Hg 12. 1 at last visit.

## 2012-09-19 ENCOUNTER — Ambulatory Visit: Payer: Self-pay | Admitting: Family Medicine

## 2012-09-20 ENCOUNTER — Telehealth: Payer: Self-pay | Admitting: *Deleted

## 2012-09-20 NOTE — Telephone Encounter (Signed)
Peggy with the [employer] program in [place] calling.  States patient stopped by requesting help getting her Medications.  She is currently taking Losartan 50 mg take one tablet by mouth daily.  There is not a drug program for this medication so they will not be able to get this for her.  The other alternatives would be Diovan, Benicar or Micardis.   If Dr. Gwenlyn Saran would like to change Losartan to one of these other medications, please fax new prescription to [phone].  Ileana Ladd

## 2012-09-25 ENCOUNTER — Other Ambulatory Visit: Payer: Self-pay | Admitting: Family Medicine

## 2012-09-25 DIAGNOSIS — I10 Essential (primary) hypertension: Secondary | ICD-10-CM

## 2012-09-25 MED ORDER — VALSARTAN 80 MG PO TABS: 80.0000 mg | ORAL_TABLET | Freq: Every day | ORAL | Status: AC

## 2012-09-25 NOTE — Telephone Encounter (Signed)
Reviewed message from [employer] regarding losartan not being covered by program. Will switch from losartan 50mg  daily to valsartan 80mg  daily since valsartan is covered by program.  Will fax Rx for this to [phone].

## 2012-09-26 ENCOUNTER — Encounter: Payer: Self-pay | Admitting: Family Medicine

## 2012-09-26 ENCOUNTER — Ambulatory Visit (INDEPENDENT_AMBULATORY_CARE_PROVIDER_SITE_OTHER): Payer: No Typology Code available for payment source | Admitting: Family Medicine

## 2012-09-26 VITALS — BP 121/79 | HR 78 | Ht 62.0 in | Wt 176.0 lb

## 2012-09-26 DIAGNOSIS — D485 Neoplasm of uncertain behavior of skin: Secondary | ICD-10-CM

## 2012-09-26 DIAGNOSIS — L918 Other hypertrophic disorders of the skin: Secondary | ICD-10-CM

## 2012-09-26 DIAGNOSIS — L909 Atrophic disorder of skin, unspecified: Secondary | ICD-10-CM

## 2012-09-26 DIAGNOSIS — E119 Type 2 diabetes mellitus without complications: Secondary | ICD-10-CM

## 2012-09-27 DIAGNOSIS — L918 Other hypertrophic disorders of the skin: Secondary | ICD-10-CM | POA: Insufficient documentation

## 2012-09-27 NOTE — Assessment & Plan Note (Signed)
Patient reporting feeling of low blood sugars but has not used meter to measure CBG. Instructed patient to take glipizide before breakfast as she had taken it after breakfast. If this doesn't work, patient to stop glipizide.

## 2012-09-27 NOTE — Assessment & Plan Note (Signed)
Skin tag on left neck removed and sent to path along with 3mm punch biopsy of underlying base.

## 2012-09-27 NOTE — Progress Notes (Signed)
Patient ID: Renee Pope    DOB: 11-Feb-1966, 47 y.o.   MRN: [id number] --- Subjective:  Renee Pope is a 47 y.o.female who presents for removal of skin tag and biopsy of underlying lesion.  - unchanged since last visit. Irritation still present.   ROS: see HPI Past Medical History: reviewed and updated medications and allergies. Social History: Tobacco: none  Objective: Filed Vitals:   09/26/12 1629  BP: 121/79  Pulse: 78    Physical Examination:   General appearance - alert, well appearing, and in no distress Skin - pedunculated skin tag on left neck. At base of skin tag is a round patch 1.1cm x1cm, hyperpigmented with some irritation and erythema along border.     Skin Tag removal and biopsy Performed by: Marena Chancy and Dr. Paula Compton Consent: signed consent obtained Risks and benefits: risks, benefits and alternatives were discussed and understood Time out performed prior to procedure Type: skin tag and underlying skin lesion  Body area: left neck Anesthesia: local infiltration Local anesthetic: lidocaine with epinephrine Anesthetic total: 1-2 ml Skin tag was cut with straight scissors.  3mm punch biopsy was taken at margin of skin lesion and normal skin.  Patient tolerance: Patient tolerated the procedure well with no immediate complications. Bleeding was controled with pressure applied to area. Antibiotic ointment was applied with dressing.

## 2012-10-02 NOTE — Telephone Encounter (Signed)
It looks like Dr. Gwenlyn Saran sent Diovan to patient's pharmacy on 09/26/2102.

## 2012-10-04 ENCOUNTER — Encounter: Payer: Self-pay | Admitting: Family Medicine

## 2012-10-04 ENCOUNTER — Ambulatory Visit (INDEPENDENT_AMBULATORY_CARE_PROVIDER_SITE_OTHER): Payer: No Typology Code available for payment source | Admitting: Family Medicine

## 2012-10-04 VITALS — BP 123/82 | HR 80 | Temp 98.2°F | Wt 177.0 lb

## 2012-10-04 DIAGNOSIS — L918 Other hypertrophic disorders of the skin: Secondary | ICD-10-CM

## 2012-10-04 DIAGNOSIS — R21 Rash and other nonspecific skin eruption: Secondary | ICD-10-CM

## 2012-10-04 DIAGNOSIS — L909 Atrophic disorder of skin, unspecified: Secondary | ICD-10-CM

## 2012-10-04 MED ORDER — CLOTRIMAZOLE 1 % EX CREA: TOPICAL_CREAM | Freq: Two times a day (BID) | CUTANEOUS | Status: AC

## 2012-10-04 NOTE — Assessment & Plan Note (Signed)
Well healed, path reviewed benign

## 2012-10-04 NOTE — Progress Notes (Signed)
  Subjective:    Patient ID: Renee Pope, female    DOB: Mar 29, 1966, 47 y.o.   MRN: [id number]  HPI 47 yo here for follow-up of skin biopsy  Performed by Dr. Gwenlyn Saran and Dr. Mauricio Po about 1 week ago for skin tag with hyperpigmented base.  Patient reports today it is itchy and burning.  States looks different but cannot elucidate how.  Has not used any creams topically  Biopsy reviewed: hypertrophic. Review of Systems     Objective:   Physical Exam GEN:  Here with daughter who she uses for an interpreter Skin:  Left lateral neck about 1.5 cm oval annular lesion with raised erythematous borders.  Area of skin tag was snipped well healed with no signs of cellulitis      Assessment & Plan:

## 2012-10-04 NOTE — Assessment & Plan Note (Signed)
Appears to be classic isolated tinea corporis patch.  No scale seen today in office, could not obtain scraping, but pathology shows scale.    Will treat empirically with clotrimazole, asked her to return in 1-2 weeks with PCP for recheck

## 2012-10-04 NOTE — Patient Instructions (Addendum)
Go pick up cream at [facility]  Can also get over the counter  Follow-up with Dr. Gwenlyn Saran in 1-2 weeks

## 2012-10-07 ENCOUNTER — Other Ambulatory Visit: Payer: Self-pay | Admitting: Family Medicine

## 2012-10-07 ENCOUNTER — Ambulatory Visit: Payer: No Typology Code available for payment source | Admitting: Emergency Medicine

## 2012-10-08 ENCOUNTER — Telehealth: Payer: Self-pay | Admitting: Family Medicine

## 2012-10-08 NOTE — Telephone Encounter (Signed)
Daughter is calling for results of skin test.  She said it is ok to leave the results with whoever answers the phone.

## 2012-10-08 NOTE — Telephone Encounter (Signed)
Will fwd. To Dr.Briscoe. Please see results. (Dr.Losq is out of country?) Thank you. Lorenda Hatchet, Renato Battles

## 2012-10-08 NOTE — Telephone Encounter (Signed)
We already discussed results with her daughter she brought to her office appointment a few days ago.  You can remind them that we discussed that the biopsy showed no signs of cancer or dangerous skin condition.  She should keep follow-up appointment for several weeks to recheck the site

## 2012-10-09 ENCOUNTER — Encounter: Payer: Self-pay | Admitting: Family Medicine

## 2012-10-09 ENCOUNTER — Ambulatory Visit (INDEPENDENT_AMBULATORY_CARE_PROVIDER_SITE_OTHER): Payer: No Typology Code available for payment source | Admitting: Family Medicine

## 2012-10-09 VITALS — BP 130/75 | HR 73 | Temp 98.8°F | Ht 62.0 in | Wt 178.0 lb

## 2012-10-09 DIAGNOSIS — R21 Rash and other nonspecific skin eruption: Secondary | ICD-10-CM

## 2012-10-09 MED ORDER — TRIAMCINOLONE ACETONIDE 0.5 % EX CREA: TOPICAL_CREAM | Freq: Three times a day (TID) | CUTANEOUS | Status: AC

## 2012-10-09 NOTE — Patient Instructions (Signed)
Stop taking the last cream you were using. Start a new cream I am sending to the pharmacy. Use it on the area and around it. Do not use it on the face.  Please see me back in 1 week.

## 2012-10-09 NOTE — Progress Notes (Addendum)
Patient ID: Renee Pope    DOB: [birth year], 47 y.o.   MRN: [id number] --- Subjective:  Renee Pope is a 47 y.o.female who presents with irritation of skin where biopsy and skin tag removal were done.  Burning for at a time every day. aslo associated with itching. Using clotrimazole cream twice a day. Has not noticed any improvement. Thinks that area has increased in size.   Biopsy from skin lesion showed the following: benign epidermal hyperplasia with hyperkeratosis.    ROS: see HPI Past Medical History: reviewed and updated medications and allergies. Social History: Tobacco: none  Objective: Filed Vitals:   10/09/12 0933  BP: 130/75  Pulse: 73  Temp: 98.8 F (37.1 C)    Physical Examination:   General appearance - alert, well appearing, and in no distress Skin - 1.8cmx1.5cm circular patch with raised erythematous border.

## 2012-10-10 NOTE — Assessment & Plan Note (Addendum)
Patient returning with worsening lesion after 4 days of clotrimazole use. Unclear etiology.  Will stop clotrimazole and try treating with triamcinolone ointment. Patient to follow up in 1 week.

## 2012-10-18 ENCOUNTER — Ambulatory Visit: Payer: No Typology Code available for payment source | Admitting: Family Medicine

## 2012-10-31 ENCOUNTER — Encounter: Payer: Self-pay | Admitting: Family Medicine

## 2012-10-31 ENCOUNTER — Ambulatory Visit (INDEPENDENT_AMBULATORY_CARE_PROVIDER_SITE_OTHER): Payer: No Typology Code available for payment source | Admitting: Family Medicine

## 2012-10-31 VITALS — BP 126/85 | HR 73 | Temp 98.6°F | Ht 62.0 in | Wt 176.0 lb

## 2012-10-31 DIAGNOSIS — R21 Rash and other nonspecific skin eruption: Secondary | ICD-10-CM

## 2012-10-31 MED ORDER — TRIAMCINOLONE ACETONIDE 0.025 % EX OINT: TOPICAL_OINTMENT | Freq: Two times a day (BID) | CUTANEOUS | Status: AC

## 2012-10-31 MED ORDER — HYDROXYZINE HCL 25 MG PO TABS: 25.0000 mg | ORAL_TABLET | Freq: Three times a day (TID) | ORAL | Status: DC | PRN

## 2012-10-31 NOTE — Progress Notes (Signed)
Patient ID: Renee Pope    DOB: Dec 14, 1965, 47 y.o.   MRN: [id number] --- Subjective:  Regene is a 47 y.o.female who presents with itchy rash on arms, back and scalp.  Rash has been present for 2-3 weeks. It is itchy. She has not tried any creams for it but has tried benadryl which doesn't help. She also has some itchiness on the back of her ears. Denies any fevers, any malaise. No change in soaps. Nobody n the family with similar rash.  The area on her neck which was biopsied along with a skin tag removed, has stopped itching and hurting with use of triamcinolone ointment.   ROS: see HPI Past Medical History: reviewed and updated medications and allergies. Social History: Tobacco: none  Objective: Filed Vitals:   10/31/12 1129  BP: 126/85  Pulse: 73  Temp: 98.6 F (37 C)    Physical Examination:   General appearance - alert, well appearing, and in no distress Skin - forearms with excoriated macules, non confluent Also present on back. Ear lobes bilaterally: sloughing skin with some erythema, Dry scalp but no evidence of rash

## 2012-10-31 NOTE — Assessment & Plan Note (Addendum)
Atopic dermatitis vs insect bites. Treat with triamcinolone and hydraxazine for itching. Gave patient info on bed bugs.

## 2012-10-31 NOTE — Patient Instructions (Addendum)
Bedbugs Bedbugs are tiny bugs that live in and around beds. During the day, they hide in mattresses and other places near beds. They come out at night and bite people lying in bed. They need blood to live and grow. Bedbugs can be found in beds anywhere. Usually, they are found in places where many people come and go (hotels, shelters, hospitals). It does not matter whether the place is dirty or clean. Getting bitten by bedbugs rarely causes a medical problem. The biggest problem can be getting rid of them. This often takes the work of a Oncologist. CAUSES  Less use of pesticides. Bedbugs were common before the 1950s. Then, strong pesticides such as DDT nearly wiped them out. Today, these pesticides are not used because they harm the environment and can cause health problems.  More travel. Besides mattresses, bedbugs can also live in clothing and luggage. They can come along as people travel from place to place. Bedbugs are more common in certain parts of the world. When people travel to those areas, the bugs can come home with them.  Presence of birds and bats. Bedbugs often infest birds and bats. If you have these animals in or near your home, bedbugs may infest your house, too. SYMPTOMS It does not hurt to be bitten by a bedbug. You will probably not wake up when you are bitten. Bedbugs usually bite areas of the skin that are not covered. Symptoms may show when you wake up, or they may take a day or more to show up. Symptoms may include:  Small red bumps on the skin. These might be lined up in a row or clustered in a group.  A darker red dot in the middle of red bumps.  Blisters on the skin. There may be swelling and very bad itching. These may be signs of an allergic reaction. This does not happen often. DIAGNOSIS Bedbug bites might look and feel like other types of insect bites. The bugs do not stay on the body like ticks or lice. They bite, drop off, and crawl away to hide.  Your caregiver will probably:  Ask about your symptoms.  Ask about your recent activities and travel.  Check your skin for bedbug bites.  Ask you to check at home for signs of bedbugs. You should look for:  Spots or stains on the bed or nearby. This could be from bedbugs that were crushed or from their eggs or waste.  Bedbugs themselves. They are reddish-brown, oval, and flat. They do not fly. They are about the size of an apple seed.  Places to look for bedbugs include:  Beds. Check mattresses, headboards, box springs, and bed frames.  On drapes and curtains near the bed.  Under carpeting in the bedroom.  Behind electrical outlets.  Behind any wallpaper that is peeling.  Inside luggage. TREATMENT Most bedbug bites do not need treatment. They usually go away on their own in a few days. The bites are not dangerous. However, treatment may be needed if you have scratched so much that your skin has become infected. You may also need treatment if you are allergic to bedbug bites. Treatment options include:  A drug that stops swelling and itching (corticosteroid). Usually, a cream is rubbed on the skin. If you have a bad rash, you may be given a corticosteroid pill.  Oral antihistamines. These are pills to help control itching.  Antibiotic medicines. An antibiotic may be prescribed for infected skin. HOME CARE  INSTRUCTIONS   Take any medicine prescribed by your caregiver for your bites. Follow the directions carefully.  Consider wearing pajamas with long sleeves and pant legs.  Your bedroom may need to be treated. A pest control expert should make sure the bedbugs are gone. You may need to throw away mattresses or luggage. Ask the pest control expert what you can do to keep the bedbugs from coming back. Common suggestions include:  Putting a plastic cover over your mattress.  Washing and drying your clothes and bedding in hot water and a hot dryer. The temperature should be  hotter than 120 F (48.9 C). Bedbugs are killed by high temperatures.  Vacuuming carefully all around your bed. Vacuum in all cracks and crevices where the bugs might hide. Do this often.  Carefully checking all used furniture, bedding, or clothes that you bring into your house.  Eliminating bird nests and bat roosts.  If you get bedbug bites when traveling, check all your possessions carefully before bringing them into your house. If you find any bugs on clothes or in your luggage, consider throwing those items away. SEEK MEDICAL CARE IF:  You have red bug bites that keep coming back.  You have red bug bites that itch badly.  You have bug bites that cause a skin rash.  You have scratch marks that are red and sore. SEEK IMMEDIATE MEDICAL CARE IF: You have a fever. Document Released: 08/12/2010 Document Revised: 10/02/2011 Document Reviewed: 08/12/2010 [facility] Patient Information 2013 [place], [place].

## 2012-11-12 ENCOUNTER — Other Ambulatory Visit: Payer: Self-pay | Admitting: Family Medicine

## 2013-05-11 IMAGING — US US PELVIS COMPLETE
1 series · 13 of 25 positions shown · non-contrast
Comparison: None.

CLINICAL DATA: Dysfunctional uterine bleeding.

TRANSABDOMINAL AND TRANSVAGINAL ULTRASOUND OF PELVIS
TECHNIQUE: Both transabdominal and transvaginal ultrasound
examinations of the pelvis were performed. Transabdominal technique
was performed for global imaging of the pelvis including uterus,
ovaries, adnexal regions, and pelvic cul-de-sac.

[Series 1: us pelvis complete · 13 of 85 slices shown]
[im 1/85]
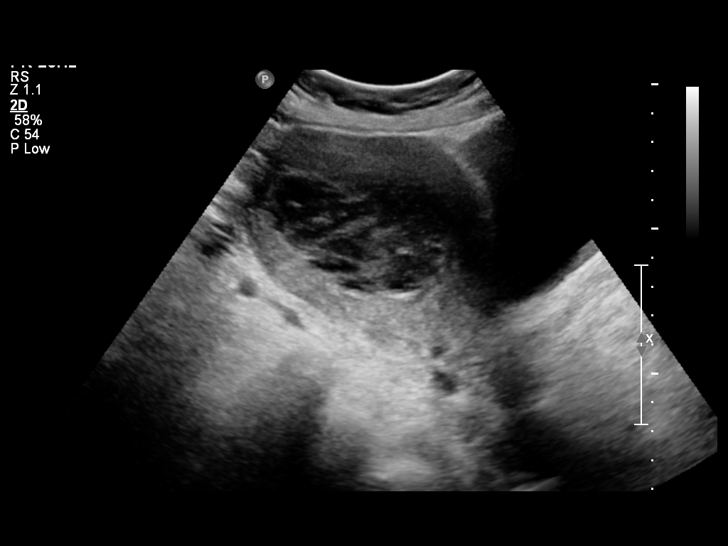
[im 8/85]
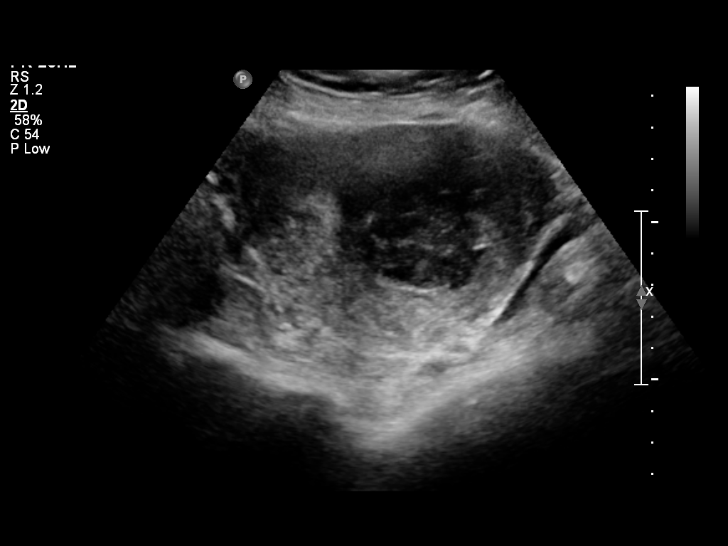
[im 15/85]
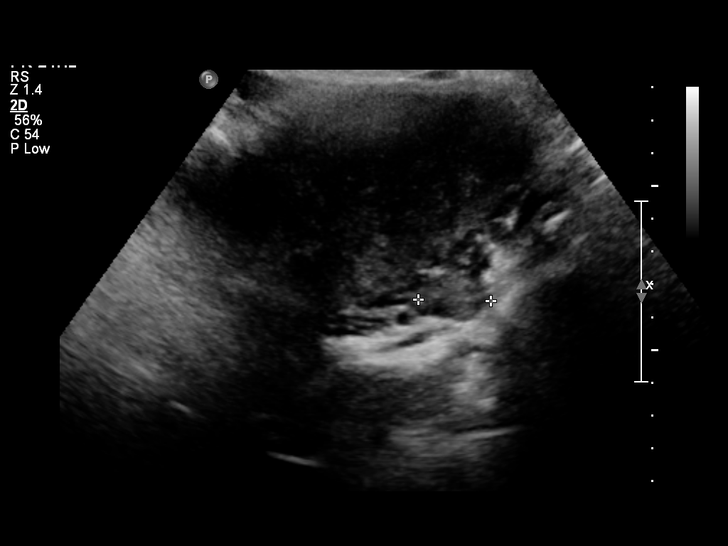
[im 22/85]
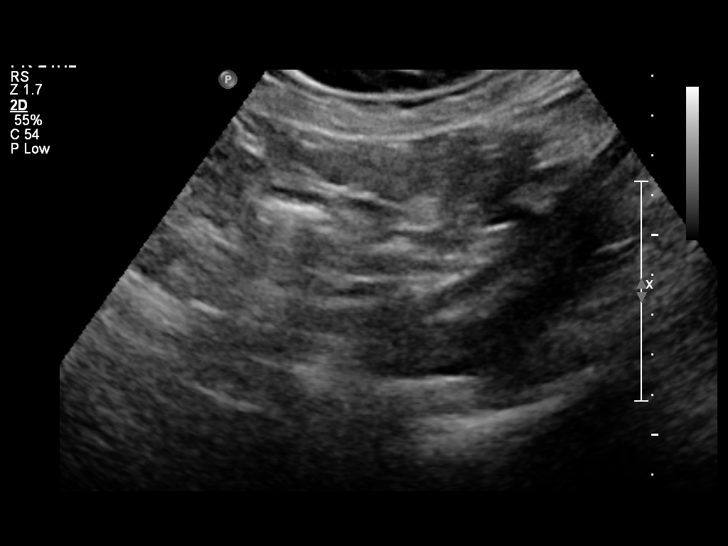
[im 29/85]
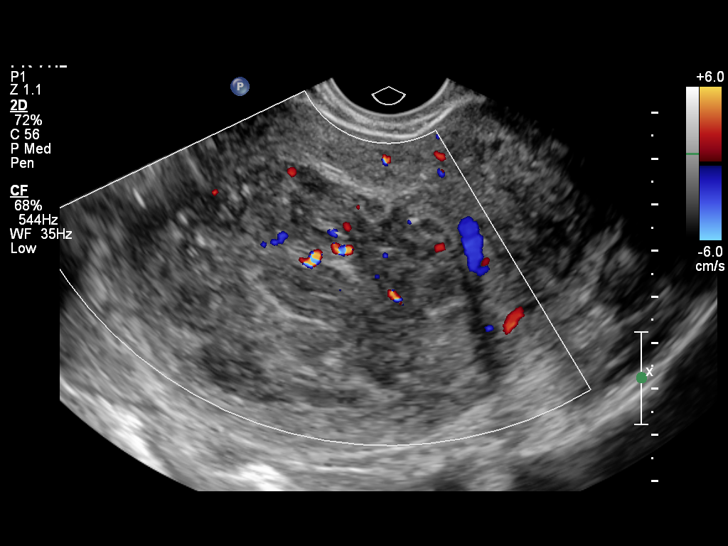
[im 36/85]
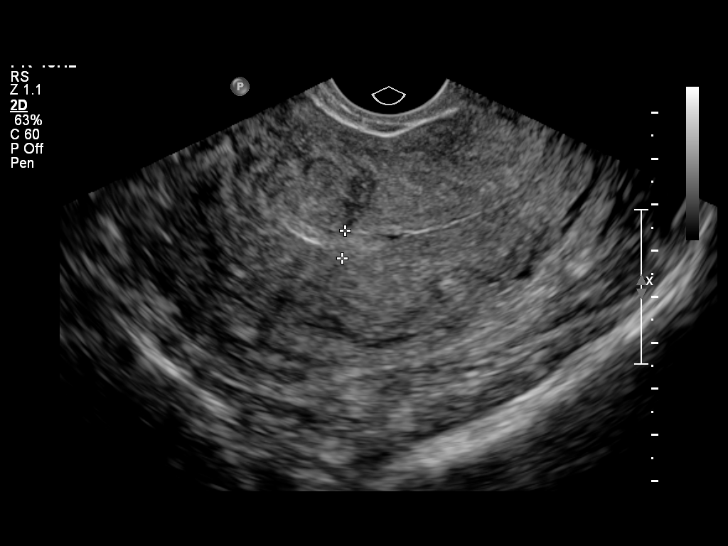
[im 43/85]
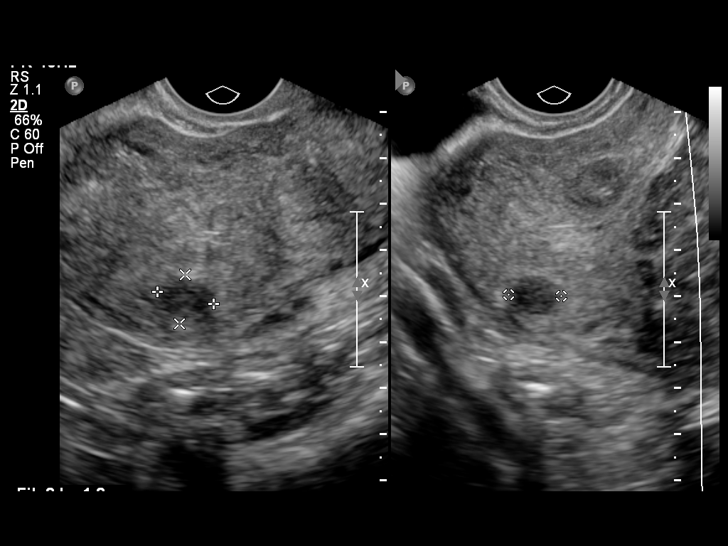
[im 50/85]
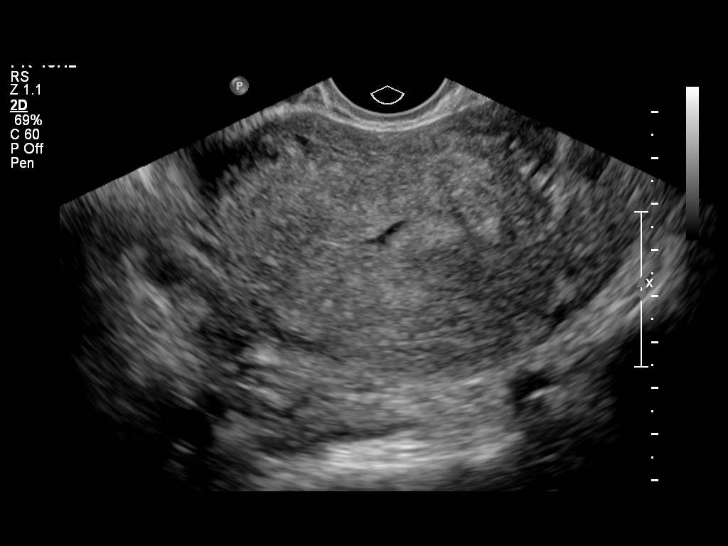
[im 57/85]
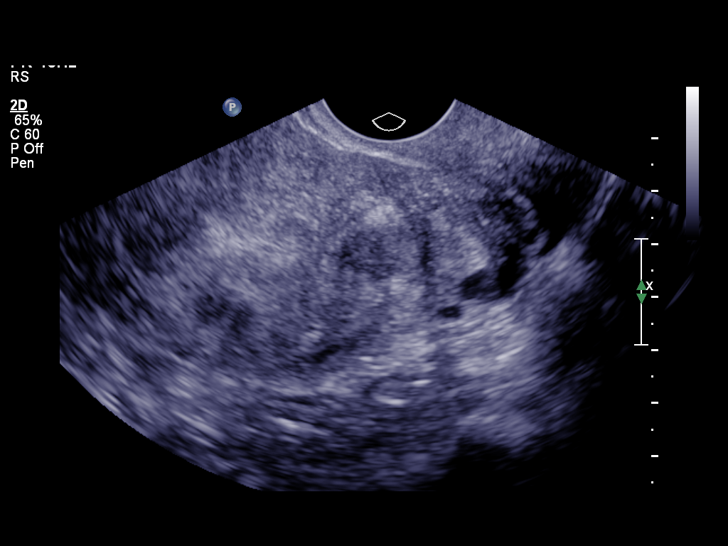
[im 64/85]
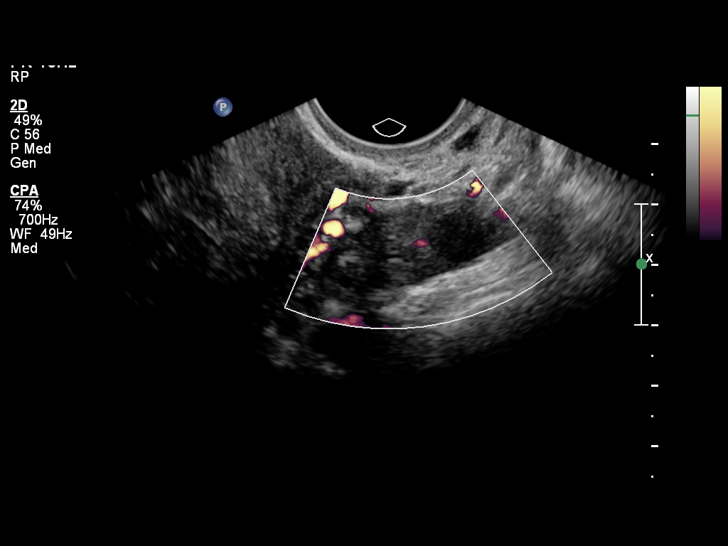
[im 71/85]
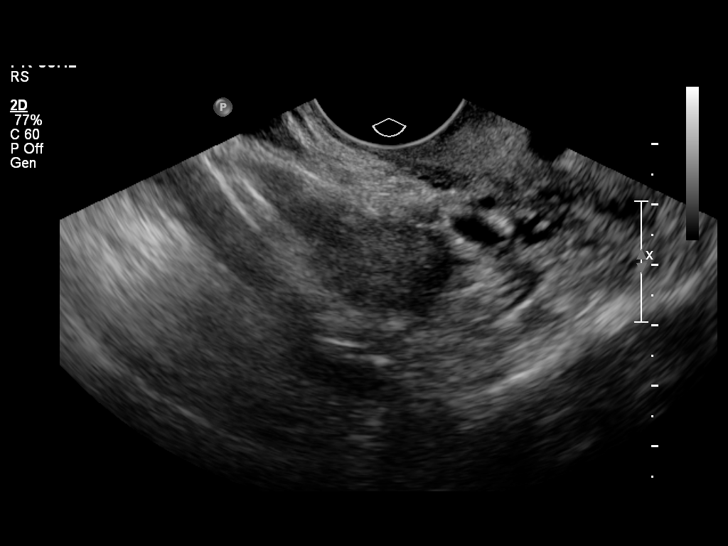
[im 78/85]
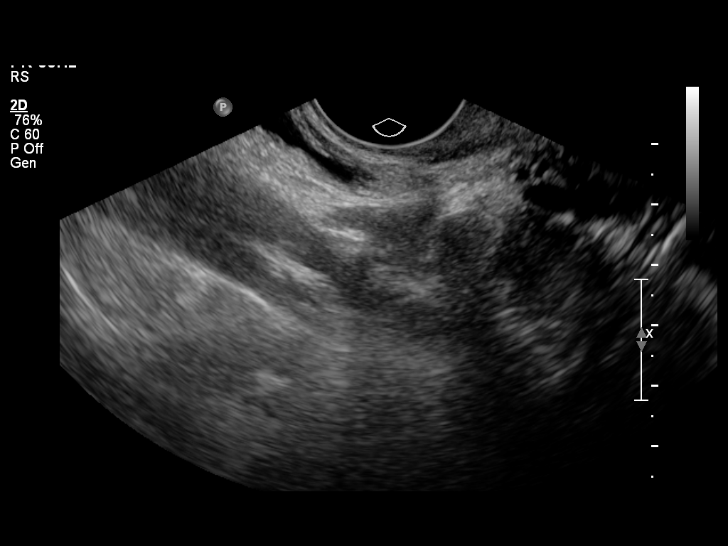
[im 85/85]
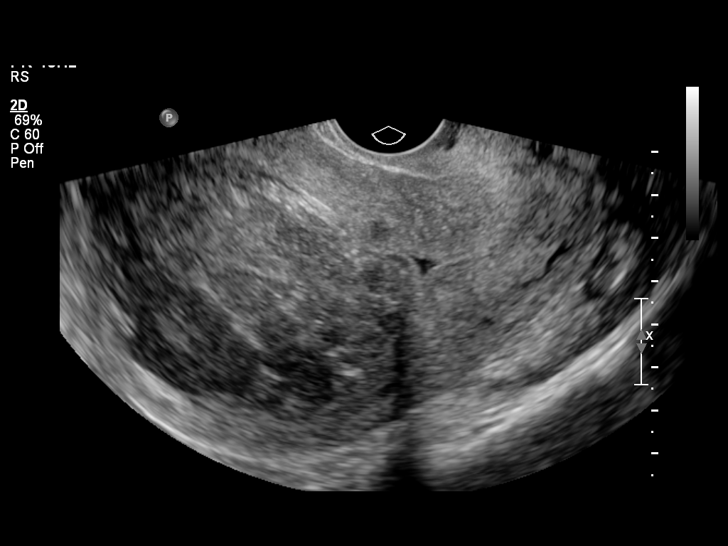

[13 of 25 positions shown; findings below may reference images not displayed]

It was necessary to proceed with endovaginal exam following the
transabdominal exam to visualize the uterus, endometrium and
ovaries in better detail.
FINDINGS: Uterus: Enlarged, measuring 12.5 x 9.3 x 8.0 cm in maximum
dimensions.

Large, oval, heterogeneous mass in the body of the uterus
posteriorly on the left.  This measures 7.5 x 6.8 x 6.0 cm in
maximum dimensions.  This is abutting the endometrial stripe and
causing anterior displacement and bowing of the stripe.

There is also a 1.2 x 1.1 x 1.1 cm oval, hypoechoic mass in the
posterior uterus on the right near the junction of the fundus and
body.  This is intramural without contact with the endometrium or
serosal surface.

There is also a 2.5 x 2.2 x 2.1 cm anterior mass on the right,
abutting the endometrium without displacement or distortion of the
endometrium.

There is also a 2.0 x 1.9 x 1.7 cm rounded mass anteriorly in the
lower uterine segment on the right, without contact with the
endometrium or serosal surface.

Endometrium: Small amount of fluid within the endometrial cavity.
There is also an oval, mildly heterogeneous and mildly echogenic
mass in the fundal portion of the endometrial cavity, measuring
x 0.9 x 0.6 cm in maximum dimensions. This is smoothly marginated.
The maximum endometrial thickness is at this level, measuring
mm.

Right ovary:  Normal appearance/no adnexal mass

Left ovary: Normal, containing a small follicle.

Other findings: No free fluid
IMPRESSION: 1.Enlarged uterus containing multiple fibroids.  The largest is
located posteriorly on the left, measuring 7.5 cm in maximum
diameter.  This is abutting the endometrial stripe and causing
anterior displacement bowing of the stripe, compatible with a
submucosal component.
2.  1.6 cm probable endometrial polyp.

## 2013-07-31 ENCOUNTER — Telehealth: Payer: Self-pay | Admitting: *Deleted

## 2013-07-31 DIAGNOSIS — E119 Type 2 diabetes mellitus without complications: Secondary | ICD-10-CM

## 2013-07-31 NOTE — Telephone Encounter (Signed)
LMOVM for pt to return call.  Please have pt make a fasting lab appt to check cholesterol.  Future orders in. Renee Pope, Renee Pope

## 2013-10-23 ENCOUNTER — Other Ambulatory Visit: Payer: Self-pay | Admitting: *Deleted

## 2013-10-23 MED ORDER — HYDROXYZINE HCL 25 MG PO TABS: 25.0000 mg | ORAL_TABLET | Freq: Three times a day (TID) | ORAL | Status: DC | PRN

## 2013-10-28 ENCOUNTER — Other Ambulatory Visit: Payer: Self-pay | Admitting: *Deleted

## 2013-10-28 MED ORDER — HYDROXYZINE HCL 25 MG PO TABS: 25.0000 mg | ORAL_TABLET | Freq: Three times a day (TID) | ORAL | Status: DC | PRN

## 2013-10-31 ENCOUNTER — Telehealth: Payer: Self-pay | Admitting: *Deleted

## 2013-10-31 NOTE — Telephone Encounter (Signed)
Left message on voicemail for patient to call our office to schedule diabetes follow up.

## 2014-01-27 ENCOUNTER — Other Ambulatory Visit: Payer: Self-pay | Admitting: Family Medicine

## 2014-04-21 ENCOUNTER — Other Ambulatory Visit: Payer: Self-pay | Admitting: Family Medicine

## 2014-04-29 ENCOUNTER — Other Ambulatory Visit: Payer: Self-pay | Admitting: Family Medicine

## 2014-05-25 ENCOUNTER — Encounter: Payer: Self-pay | Admitting: Family Medicine
